# Patient Record
Sex: Female | Born: 2000 | Race: Black or African American | Hispanic: No | Marital: Single | Smoking: Never smoker
Health system: Southern US, Community
[De-identification: ages and names within clinical notes are randomized; demographics above are authoritative.]

## PROBLEM LIST (undated history)

## (undated) DIAGNOSIS — W3400XA Accidental discharge from unspecified firearms or gun, initial encounter: Secondary | ICD-10-CM

---
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
## 2019-10-16 ENCOUNTER — Encounter: Payer: Self-pay | Admitting: Obstetrics and Gynecology

## 2019-10-16 ENCOUNTER — Ambulatory Visit (INDEPENDENT_AMBULATORY_CARE_PROVIDER_SITE_OTHER): Payer: Medicaid Other | Admitting: Obstetrics and Gynecology

## 2019-10-16 ENCOUNTER — Other Ambulatory Visit: Payer: Self-pay

## 2019-10-16 ENCOUNTER — Other Ambulatory Visit (HOSPITAL_COMMUNITY)
Admission: RE | Admit: 2019-10-16 | Discharge: 2019-10-16 | Disposition: A | Discharge status: Home / Self Care | Payer: Medicaid Other | Source: Ambulatory Visit | Attending: Obstetrics and Gynecology | Admitting: Obstetrics and Gynecology

## 2019-10-16 VITALS — BP 135/81 | HR 86 | Wt 122.1 lb

## 2019-10-16 DIAGNOSIS — Z113 Encounter for screening for infections with a predominantly sexual mode of transmission: Secondary | ICD-10-CM

## 2019-10-16 DIAGNOSIS — Z23 Encounter for immunization: Secondary | ICD-10-CM | POA: Diagnosis not present

## 2019-10-16 DIAGNOSIS — Z0000 Encounter for general adult medical examination without abnormal findings: Secondary | ICD-10-CM
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
## 2019-10-16 NOTE — Progress Notes (Signed)
Has Nexplanon  Just wants to establish care and get tested for all things.

## 2019-10-17 LAB — RPR: RPR Ser Ql: NONREACTIVE

## 2019-10-17 LAB — CERVICOVAGINAL ANCILLARY ONLY
Bacterial Vaginitis (gardnerella): POSITIVE — AB
Candida Glabrata: NEGATIVE
Candida Vaginitis: POSITIVE — AB
Chlamydia: NEGATIVE
Comment: NEGATIVE
Comment: NEGATIVE
Comment: NEGATIVE
Comment: NEGATIVE
Comment: NEGATIVE
Comment: NORMAL
Neisseria Gonorrhea: NEGATIVE
Trichomonas: POSITIVE — AB

## 2019-10-17 LAB — HEPATITIS C ANTIBODY: Hep C Virus Ab: <0.1 s/co ratio (ref 0.0–0.9)

## 2019-10-17 LAB — HEPATITIS B SURFACE ANTIGEN: Hepatitis B Surface Ag: NEGATIVE

## 2019-10-17 LAB — HIV ANTIBODY (ROUTINE TESTING W REFLEX): HIV Screen 4th Generation wRfx: NONREACTIVE

## 2019-10-23 ENCOUNTER — Encounter: Payer: Self-pay | Admitting: General Practice

## 2019-10-23 ENCOUNTER — Telehealth: Payer: Self-pay | Admitting: Obstetrics and Gynecology

## 2019-10-23 MED ORDER — METRONIDAZOLE 500 MG PO TABS: 2000.0000 mg | ORAL_TABLET | Freq: Once | ORAL | 0 refills | Status: AC

## 2019-10-23 NOTE — Telephone Encounter (Signed)
+   trichomonas. Have attempted to call patient several times. No answer and no option to leave VM. Will have office send her a letter.   Rx: Flagyl 2 grams PO   Clary Boulais, Harolyn Rutherford, NP 10/23/2019 9:33 AM

## 2020-05-28 ENCOUNTER — Ambulatory Visit: Admit: 2020-05-28 | Discharge status: Against Medical Advice | Payer: Medicaid Other

## 2020-05-29 ENCOUNTER — Ambulatory Visit: Payer: Self-pay

## 2020-05-30 ENCOUNTER — Ambulatory Visit: Payer: Self-pay

## 2020-05-31 ENCOUNTER — Encounter: Payer: Self-pay | Admitting: Emergency Medicine

## 2020-05-31 ENCOUNTER — Ambulatory Visit: Payer: Self-pay

## 2020-05-31 ENCOUNTER — Ambulatory Visit
Admission: EM | Admit: 2020-05-31 | Discharge: 2020-05-31 | Disposition: A | Discharge status: Home / Self Care | Payer: Medicaid Other | Attending: Emergency Medicine | Admitting: Emergency Medicine

## 2020-05-31 DIAGNOSIS — R300 Dysuria: Secondary | ICD-10-CM | POA: Diagnosis not present

## 2020-05-31 DIAGNOSIS — Z202 Contact with and (suspected) exposure to infections with a predominantly sexual mode of transmission: Secondary | ICD-10-CM | POA: Diagnosis present

## 2020-05-31 LAB — POCT URINALYSIS DIP (MANUAL ENTRY)
Bilirubin, UA: NEGATIVE
Glucose, UA: NEGATIVE mg/dL
Ketones, POC UA: NEGATIVE mg/dL
Nitrite, UA: POSITIVE — AB
Protein Ur, POC: 100 mg/dL — AB
Spec Grav, UA: >=1.03 — AB (ref 1.010–1.025)
Urobilinogen, UA: 1 E.U./dL
pH, UA: 5.5 (ref 5.0–8.0)

## 2020-05-31 MED ORDER — NITROFURANTOIN MONOHYD MACRO 100 MG PO CAPS: 100.0000 mg | ORAL_CAPSULE | Freq: Two times a day (BID) | ORAL | 0 refills | Status: DC

## 2020-05-31 MED ORDER — METRONIDAZOLE 500 MG PO TABS: 500.0000 mg | ORAL_TABLET | Freq: Two times a day (BID) | ORAL | 0 refills | Status: DC
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
## 2020-05-31 NOTE — Discharge Instructions (Signed)
Urine concerning for infection Culture sent.  We will follow up with you regarding abnormal results Vaginal self-swab obtained.  We will follow up with you regarding abnormal results Macrobid prescribed for UTI Metronidazole prescribed for trich infection If tests results are positive, please abstain from sexual activity until you and your partner(s) have been treated Follow up with PCP Return here or go to ER if you have any new or worsening symptoms fever, chills, nausea, vomiting, abdominal or pelvic pain, painful intercourse, vaginal discharge, vaginal bleeding, persistent symptoms despite treatment, etc..Marland Kitchen

## 2020-05-31 NOTE — ED Triage Notes (Addendum)
Partner recently tested + for trich.  Pt is having burning with urination. Pt is currently on her menstrual cycle.

## 2020-06-02 LAB — CERVICOVAGINAL ANCILLARY ONLY
Bacterial Vaginitis (gardnerella): POSITIVE — AB
Candida Glabrata: NEGATIVE
Candida Vaginitis: POSITIVE — AB
Chlamydia: NEGATIVE
Comment: NEGATIVE
Comment: NEGATIVE
Comment: NEGATIVE
Comment: NEGATIVE
Comment: NEGATIVE
Comment: NORMAL
Neisseria Gonorrhea: NEGATIVE
Trichomonas: POSITIVE — AB

## 2020-06-03 ENCOUNTER — Telehealth (HOSPITAL_COMMUNITY): Payer: Self-pay | Admitting: Emergency Medicine

## 2020-06-03 LAB — URINE CULTURE
Culture: >=100000 — AB
Special Requests: NORMAL

## 2020-06-03 MED ORDER — FLUCONAZOLE 150 MG PO TABS: 150.0000 mg | ORAL_TABLET | Freq: Once | ORAL | 0 refills | Status: AC

## 2021-09-30 ENCOUNTER — Ambulatory Visit: Payer: Medicaid Other | Admitting: Family

## 2021-10-07 DIAGNOSIS — Z3046 Encounter for surveillance of implantable subdermal contraceptive: Secondary | ICD-10-CM | POA: Diagnosis not present

## 2021-10-07 DIAGNOSIS — Z113 Encounter for screening for infections with a predominantly sexual mode of transmission: Secondary | ICD-10-CM | POA: Diagnosis not present

## 2021-11-24 ENCOUNTER — Ambulatory Visit: Payer: Medicaid Other | Admitting: Obstetrics and Gynecology

## 2021-12-01 ENCOUNTER — Ambulatory Visit: Payer: Medicaid Other | Admitting: Certified Nurse Midwife

## 2023-02-08 DIAGNOSIS — Z3046 Encounter for surveillance of implantable subdermal contraceptive: Secondary | ICD-10-CM | POA: Diagnosis not present

## 2023-04-15 ENCOUNTER — Ambulatory Visit
Admission: RE | Admit: 2023-04-15 | Discharge: 2023-04-15 | Disposition: A | Discharge status: Home / Self Care | Payer: Medicaid Other | Source: Ambulatory Visit | Attending: Physician Assistant | Admitting: Physician Assistant

## 2023-04-15 ENCOUNTER — Ambulatory Visit: Payer: Medicaid Other

## 2023-04-15 VITALS — BP 127/84 | HR 103 | Temp 98.2°F | Resp 13

## 2023-04-15 DIAGNOSIS — Z3201 Encounter for pregnancy test, result positive: Secondary | ICD-10-CM

## 2023-04-15 LAB — POCT URINE PREGNANCY: Preg Test, Ur: POSITIVE — AB
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
## 2023-04-15 NOTE — Discharge Instructions (Signed)
Start a prenatal vitamin  Avoid smoking, drinking alcohol, and drug use.  Contact OBGYN on Monday.  They will typically schedule your first appointment around 7-10 weeks.  Some spotting is normal, but if you are experiencing heavy bleeding please go to [facility] at [address]

## 2023-04-15 NOTE — ED Triage Notes (Signed)
Pt presents for pregnancy test. She took two home test and the line was faded. Pt LMP was 7/4.

## 2023-04-16 ENCOUNTER — Ambulatory Visit: Payer: Medicaid Other

## 2023-04-20 ENCOUNTER — Ambulatory Visit: Payer: Medicaid Other

## 2023-04-22 ENCOUNTER — Ambulatory Visit
Admission: RE | Admit: 2023-04-22 | Discharge: 2023-04-22 | Disposition: A | Discharge status: Home / Self Care | Payer: Medicaid Other | Source: Ambulatory Visit | Attending: Physician Assistant | Admitting: Physician Assistant

## 2023-04-22 ENCOUNTER — Other Ambulatory Visit: Payer: Self-pay

## 2023-04-22 VITALS — BP 110/67 | HR 108 | Temp 98.2°F | Resp 16 | Ht 61.0 in | Wt 125.0 lb

## 2023-04-22 DIAGNOSIS — Z113 Encounter for screening for infections with a predominantly sexual mode of transmission: Secondary | ICD-10-CM | POA: Insufficient documentation

## 2023-04-22 NOTE — ED Triage Notes (Signed)
"  I am pregnant and want to make sure everything is ok". "I want to be checked for STI's including blood work". Current Symptoms: "cramps". No STI symptoms.
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
## 2023-04-24 ENCOUNTER — Ambulatory Visit: Payer: Medicaid Other

## 2023-04-25 ENCOUNTER — Telehealth (HOSPITAL_COMMUNITY): Payer: Self-pay | Admitting: Emergency Medicine

## 2023-04-25 MED ORDER — METRONIDAZOLE 500 MG PO TABS: 500.0000 mg | ORAL_TABLET | Freq: Two times a day (BID) | ORAL | 0 refills | Status: DC

## 2023-05-10 ENCOUNTER — Ambulatory Visit: Payer: Medicaid Other | Admitting: Internal Medicine

## 2023-05-13 ENCOUNTER — Emergency Department (HOSPITAL_COMMUNITY)
Admission: EM | Admit: 2023-05-13 | Discharge: 2023-05-13 | Disposition: A | Discharge status: Home / Self Care | Payer: Medicaid Other | Attending: Emergency Medicine | Admitting: Emergency Medicine

## 2023-05-13 ENCOUNTER — Other Ambulatory Visit: Payer: Self-pay

## 2023-05-13 ENCOUNTER — Emergency Department (HOSPITAL_COMMUNITY): Payer: Medicaid Other

## 2023-05-13 DIAGNOSIS — O368910 Maternal care for other specified fetal problems, first trimester, not applicable or unspecified: Secondary | ICD-10-CM | POA: Diagnosis not present

## 2023-05-13 DIAGNOSIS — O208 Other hemorrhage in early pregnancy: Secondary | ICD-10-CM | POA: Diagnosis not present

## 2023-05-13 DIAGNOSIS — Z3A01 Less than 8 weeks gestation of pregnancy: Secondary | ICD-10-CM | POA: Insufficient documentation

## 2023-05-13 DIAGNOSIS — R1030 Lower abdominal pain, unspecified: Secondary | ICD-10-CM | POA: Diagnosis not present

## 2023-05-13 DIAGNOSIS — Z3490 Encounter for supervision of normal pregnancy, unspecified, unspecified trimester: Secondary | ICD-10-CM

## 2023-05-13 DIAGNOSIS — O26891 Other specified pregnancy related conditions, first trimester: Secondary | ICD-10-CM | POA: Diagnosis not present

## 2023-05-13 LAB — URINALYSIS, ROUTINE W REFLEX MICROSCOPIC
Bilirubin Urine: NEGATIVE
Glucose, UA: NEGATIVE mg/dL
Hgb urine dipstick: NEGATIVE
Ketones, ur: 20 mg/dL — AB
Leukocytes,Ua: NEGATIVE
Nitrite: NEGATIVE
Protein, ur: NEGATIVE mg/dL
Specific Gravity, Urine: 1.019 (ref 1.005–1.030)
pH: 6 (ref 5.0–8.0)

## 2023-05-13 LAB — PREGNANCY, URINE: Preg Test, Ur: POSITIVE — AB

## 2023-05-13 LAB — HCG, QUANTITATIVE, PREGNANCY: hCG, Beta Chain, Quant, S: 47767 m[IU]/mL — ABNORMAL HIGH (ref <5)

## 2023-05-13 MED ORDER — PRENATAL COMPLETE 14-0.4 MG PO TABS: 1.0000 | ORAL_TABLET | Freq: Every day | ORAL | 1 refills | Status: DC

## 2023-05-13 NOTE — ED Triage Notes (Signed)
Pt arrived via POV. C/o intermittent cramping since they found out they were pregnant. Pt states they are a few weeks along. No bleeding.

## 2023-05-13 NOTE — Discharge Instructions (Addendum)
The ultrasound shows an intrauterine pregnancy at 7 weeks 1 day.  Follow up with an OB GYN doctor to establish prenatal care.  You can take tylenol safely during pregnancy.  Start taking prenatal vitamins.

## 2023-05-13 NOTE — ED Provider Notes (Signed)
Pt initially seen by Dr Theresia Lo.  Pt with lower abd cramping, early pregnancy.  Plan was to follow up on pelvic ultrasound.  US shows 7 week 1 day iup with fetal cardiac activity.  Small subchorionic hemorrhage noted  Stable for outpatient OB/GYN follow-up   Linwood Dibbles, MD 05/13/23 1708

## 2023-05-13 NOTE — ED Notes (Signed)
Pt ambulated to BR with steady gait to attempt to give urine sample.

## 2023-05-13 NOTE — ED Notes (Signed)
AVS with prescriptions provided to and discussed with patient. Pt verbalizes understanding of discharge instructions and denies any questions or concerns at this time. Pt ambulated out of department independently with steady gait. ? ?

## 2023-05-13 NOTE — ED Provider Notes (Signed)
[facility] Provider Note   CSN: [id number] Arrival date & time: 05/13/23  1323     History  Chief Complaint  Patient presents with   Abdominal Cramping    Yolanda Dunn is a 22 y.o. female.  Patient is a 22 year old female at approximately [redacted] weeks gestation by LMP presenting to the emergency department with abdominal pain.  Patient reports over the last 2 weeks she has had increasing cramping across her lower abdomen.  She states that she woke up this morning with a cramping pain.  She denies any fevers, nausea, vomiting, diarrhea or constipation, dysuria or hematuria.  She denies any vaginal bleeding, discharge or leakage of fluid.  She states that she has not yet had an ultrasound this of pregnancy or established with OB.  The history is provided by the patient.  Abdominal Cramping       Home Medications Prior to Admission medications   Medication Sig Start Date End Date Taking? Authorizing Provider  metroNIDAZOLE (FLAGYL) 500 MG tablet Take 1 tablet (500 mg total) by mouth 2 (two) times daily. 04/25/23   Merrilee Jansky, MD  ferrous gluconate (FERGON) 324 MG tablet Take 1 tablet (324 mg total) by mouth 2 (two) times daily with a meal. Patient not taking: Reported on 02/19/2018 01/15/18 05/31/20  Barnetta Chapel, PA-C      Allergies    Patient has no known allergies.    Review of Systems   Review of Systems  Physical Exam Updated Vital Signs BP (!) 142/97 (BP Location: Left Arm)   Pulse 92   Temp 98.6 F (37 C) (Oral)   Resp 18   Ht 5\' 1"  (1.549 m)   Wt 56.7 kg   LMP 03/22/2023 (Exact Date)   SpO2 100%   BMI 23.62 kg/m  Physical Exam Vitals and nursing note reviewed.  Constitutional:      General: She is not in acute distress.    Appearance: Normal appearance.  HENT:     Head: Normocephalic and atraumatic.     Nose: Nose normal.     Mouth/Throat:     Mouth: Mucous membranes are moist.  Eyes:      Extraocular Movements: Extraocular movements intact.     Conjunctiva/sclera: Conjunctivae normal.  Cardiovascular:     Rate and Rhythm: Normal rate and regular rhythm.     Heart sounds: Normal heart sounds.  Pulmonary:     Effort: Pulmonary effort is normal.     Breath sounds: Normal breath sounds.  Abdominal:     General: Abdomen is flat.     Palpations: Abdomen is soft.     Tenderness: There is no abdominal tenderness. There is no right CVA tenderness or left CVA tenderness.  Musculoskeletal:        General: Normal range of motion.     Cervical back: Normal range of motion.  Skin:    General: Skin is warm and dry.  Neurological:     General: No focal deficit present.     Mental Status: She is alert and oriented to person, place, and time.  Psychiatric:        Mood and Affect: Mood normal.        Behavior: Behavior normal.     ED Results / Procedures / Treatments   Labs (all labs ordered are listed, but only abnormal results are displayed) Labs Reviewed  URINALYSIS, ROUTINE W REFLEX MICROSCOPIC - Abnormal; Notable for the following components:  Result Value   APPearance CLOUDY (*)    Ketones, ur 20 (*)    All other components within normal limits  PREGNANCY, URINE - Abnormal; Notable for the following components:   Preg Test, Ur POSITIVE (*)    All other components within normal limits  HCG, QUANTITATIVE, PREGNANCY    EKG None  Radiology No results found.  Procedures Procedures    Medications Ordered in ED Medications - No data to display  ED Course/ Medical Decision Making/ A&P Clinical Course as of 05/13/23 1514  Sun May 13, 2023  1514 Patient signed out to Dr. Lynelle Doctor pending [place] and reassessment. [VK]    Clinical Course User Index [VK] Rexford Maus, DO                                 Medical Decision Making This patient presents to the ED with chief complaint(s) of abdominal pain with pertinent past medical history of [redacted] weeks pregnant  which further complicates the presenting complaint. The complaint involves an extensive differential diagnosis and also carries with it a high risk of complications and morbidity.    The differential diagnosis includes UTI, ectopic pregnancy, miscarriage, no fever or point abdominal tenderness making appendicitis or other intra-abdominal infection unlikely  Additional history obtained: Additional history obtained from N/A Records reviewed urgent care records  ED Course and Reassessment: On patient's arrival she is hemodynamically stable in no acute distress.  Abdomen is soft and minimally tender to palpation.  Patient will have urine performed to evaluate for UTI as well as an ultrasound to evaluate for IUP versus ectopic.  Patient declined any pain medication at this time and will be closely reassessed.  Independent labs interpretation:  The following labs were independently interpreted: UA negative, positive pregnancy  Independent visualization of imaging: - Pending     Amount and/or Complexity of Data Reviewed Labs: ordered. Radiology: ordered.          Final Clinical Impression(s) / ED Diagnoses Final diagnoses:  None    Rx / DC Orders ED Discharge Orders     None         Rexford Maus, DO 05/13/23 1514

## 2023-05-18 ENCOUNTER — Ambulatory Visit: Payer: Medicaid Other | Admitting: Nurse Practitioner

## 2023-05-18 ENCOUNTER — Telehealth: Payer: Self-pay

## 2023-05-18 NOTE — Telephone Encounter (Signed)
8.30.24 pt no show/ pt called today @ 1:09pm to cancel due to work/ no letter sent / block for future appt

## 2023-05-24 ENCOUNTER — Ambulatory Visit: Payer: Medicaid Other

## 2023-06-01 ENCOUNTER — Ambulatory Visit: Payer: Medicaid Other

## 2023-06-05 ENCOUNTER — Telehealth: Payer: Medicaid Other

## 2023-06-13 ENCOUNTER — Ambulatory Visit: Payer: Medicaid Other

## 2023-06-13 ENCOUNTER — Ambulatory Visit (INDEPENDENT_AMBULATORY_CARE_PROVIDER_SITE_OTHER): Payer: Medicaid Other | Admitting: Obstetrics & Gynecology

## 2023-06-13 ENCOUNTER — Other Ambulatory Visit (HOSPITAL_COMMUNITY)
Admission: RE | Admit: 2023-06-13 | Discharge: 2023-06-13 | Disposition: A | Discharge status: Home / Self Care | Payer: Medicaid Other | Source: Ambulatory Visit | Attending: Obstetrics & Gynecology | Admitting: Obstetrics & Gynecology

## 2023-06-13 ENCOUNTER — Other Ambulatory Visit: Payer: Self-pay

## 2023-06-13 ENCOUNTER — Encounter: Payer: Self-pay | Admitting: Obstetrics & Gynecology

## 2023-06-13 VITALS — BP 109/70 | HR 90 | Wt 123.0 lb

## 2023-06-13 DIAGNOSIS — Z3491 Encounter for supervision of normal pregnancy, unspecified, first trimester: Secondary | ICD-10-CM

## 2023-06-13 DIAGNOSIS — Z3490 Encounter for supervision of normal pregnancy, unspecified, unspecified trimester: Secondary | ICD-10-CM | POA: Insufficient documentation

## 2023-06-13 DIAGNOSIS — Z3A11 11 weeks gestation of pregnancy: Secondary | ICD-10-CM | POA: Diagnosis not present

## 2023-06-13 MED ORDER — BLOOD PRESSURE KIT DEVI
1.0000 | Freq: Once | 0 refills | Status: AC
Start: 2023-06-13 — End: 2023-06-13

## 2023-06-13 MED ORDER — ASPIRIN 81 MG PO TBEC
81.0000 mg | DELAYED_RELEASE_TABLET | Freq: Every day | ORAL | 2 refills | Status: DC
Start: 2023-06-13 — End: 2023-09-01
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
## 2023-06-14 ENCOUNTER — Encounter: Payer: Self-pay | Admitting: *Deleted

## 2023-06-14 LAB — COMPREHENSIVE METABOLIC PANEL
ALT: 13 IU/L (ref 0–32)
AST: 14 IU/L (ref 0–40)
Albumin: 4.1 g/dL (ref 4.0–5.0)
Alkaline Phosphatase: 51 IU/L (ref 44–121)
BUN/Creatinine Ratio: 15 (ref 9–23)
BUN: 8 mg/dL (ref 6–20)
Bilirubin Total: 0.2 mg/dL (ref 0.0–1.2)
CO2: 21 mmol/L (ref 20–29)
Calcium: 9.2 mg/dL (ref 8.7–10.2)
Chloride: 103 mmol/L (ref 96–106)
Creatinine, Ser: 0.55 mg/dL — ABNORMAL LOW (ref 0.57–1.00)
Globulin, Total: 2.9 g/dL (ref 1.5–4.5)
Glucose: 81 mg/dL (ref 70–99)
Potassium: 4.4 mmol/L (ref 3.5–5.2)
Sodium: 138 mmol/L (ref 134–144)
Total Protein: 7 g/dL (ref 6.0–8.5)
eGFR: 132 mL/min/{1.73_m2} (ref >59)

## 2023-06-14 LAB — HEMOGLOBIN A1C
Est. average glucose Bld gHb Est-mCnc: 97 mg/dL
Hgb A1c MFr Bld: 5 % (ref 4.8–5.6)

## 2023-06-14 LAB — CBC/D/PLT+RPR+RH+ABO+RUBIGG...
Antibody Screen: NEGATIVE
Basophils Absolute: 0 10*3/uL (ref 0.0–0.2)
Basos: 0 %
EOS (ABSOLUTE): 0 10*3/uL (ref 0.0–0.4)
Eos: 0 %
HCV Ab: NONREACTIVE
HIV Screen 4th Generation wRfx: NONREACTIVE
Hematocrit: 37 % (ref 34.0–46.6)
Hemoglobin: 12 g/dL (ref 11.1–15.9)
Hepatitis B Surface Ag: NEGATIVE
Immature Grans (Abs): 0 10*3/uL (ref 0.0–0.1)
Immature Granulocytes: 0 %
Lymphocytes Absolute: 1.6 10*3/uL (ref 0.7–3.1)
Lymphs: 29 %
MCH: 32.9 pg (ref 26.6–33.0)
MCHC: 32.4 g/dL (ref 31.5–35.7)
MCV: 101 fL — ABNORMAL HIGH (ref 79–97)
Monocytes Absolute: 0.4 10*3/uL (ref 0.1–0.9)
Monocytes: 8 %
Neutrophils Absolute: 3.4 10*3/uL (ref 1.4–7.0)
Neutrophils: 63 %
Platelets: 225 10*3/uL (ref 150–450)
RBC: 3.65 x10E6/uL — ABNORMAL LOW (ref 3.77–5.28)
RDW: 12.5 % (ref 11.7–15.4)
RPR Ser Ql: NONREACTIVE
Rh Factor: POSITIVE
Rubella Antibodies, IGG: 3.34 index (ref >0.99)
WBC: 5.4 10*3/uL (ref 3.4–10.8)

## 2023-06-14 LAB — HCV INTERPRETATION

## 2023-06-15 LAB — CYTOLOGY - PAP
Chlamydia: NEGATIVE
Comment: NEGATIVE
Comment: NEGATIVE
Comment: NEGATIVE
Comment: NORMAL
Diagnosis: NEGATIVE
High risk HPV: NEGATIVE
Neisseria Gonorrhea: NEGATIVE
Trichomonas: NEGATIVE

## 2023-06-15 LAB — CULTURE, OB URINE

## 2023-06-15 LAB — URINE CULTURE, OB REFLEX

## 2023-06-20 LAB — HORIZON CUSTOM: REPORT SUMMARY: NEGATIVE

## 2023-06-20 LAB — PANORAMA PRENATAL TEST FULL PANEL:PANORAMA TEST PLUS 5 ADDITIONAL MICRODELETIONS: FETAL FRACTION: 9.1

## 2023-07-11 ENCOUNTER — Other Ambulatory Visit: Payer: Self-pay

## 2023-07-11 ENCOUNTER — Ambulatory Visit (INDEPENDENT_AMBULATORY_CARE_PROVIDER_SITE_OTHER): Payer: Medicaid Other | Admitting: Family Medicine

## 2023-07-11 VITALS — BP 116/85 | HR 112 | Wt 125.3 lb

## 2023-07-11 DIAGNOSIS — Z3492 Encounter for supervision of normal pregnancy, unspecified, second trimester: Secondary | ICD-10-CM

## 2023-07-11 DIAGNOSIS — Z3A15 15 weeks gestation of pregnancy: Secondary | ICD-10-CM

## 2023-07-11 DIAGNOSIS — W3400XA Accidental discharge from unspecified firearms or gun, initial encounter: Secondary | ICD-10-CM
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
## 2023-07-13 ENCOUNTER — Encounter: Payer: Self-pay | Admitting: Family Medicine

## 2023-07-13 LAB — AFP, SERUM, OPEN SPINA BIFIDA
AFP MoM: 2.18
AFP Value: 78.6 ng/mL
Gest. Age on Collection Date: 15 wk
Maternal Age At EDD: 23.7 a
OSBR Risk 1 IN: 1092
Test Results:: NEGATIVE
Weight: 125 [lb_av]

## 2023-07-22 ENCOUNTER — Encounter: Payer: Self-pay | Admitting: Obstetrics & Gynecology

## 2023-08-01 ENCOUNTER — Encounter: Payer: Self-pay | Admitting: *Deleted

## 2023-08-01 DIAGNOSIS — Z3687 Encounter for antenatal screening for uncertain dates: Secondary | ICD-10-CM | POA: Insufficient documentation

## 2023-08-06 ENCOUNTER — Ambulatory Visit: Payer: Medicaid Other | Admitting: *Deleted

## 2023-08-06 ENCOUNTER — Encounter: Payer: Self-pay | Admitting: *Deleted

## 2023-08-06 ENCOUNTER — Ambulatory Visit: Payer: Medicaid Other | Attending: Obstetrics & Gynecology

## 2023-08-06 ENCOUNTER — Other Ambulatory Visit: Payer: Self-pay

## 2023-08-06 ENCOUNTER — Other Ambulatory Visit: Payer: Self-pay | Admitting: *Deleted

## 2023-08-06 VITALS — BP 112/57 | HR 63

## 2023-08-06 DIAGNOSIS — O358XX0 Maternal care for other (suspected) fetal abnormality and damage, not applicable or unspecified: Secondary | ICD-10-CM | POA: Insufficient documentation

## 2023-08-06 DIAGNOSIS — Z3492 Encounter for supervision of normal pregnancy, unspecified, second trimester: Secondary | ICD-10-CM

## 2023-08-06 DIAGNOSIS — Z3491 Encounter for supervision of normal pregnancy, unspecified, first trimester: Secondary | ICD-10-CM | POA: Diagnosis not present

## 2023-08-06 DIAGNOSIS — Z362 Encounter for other antenatal screening follow-up: Secondary | ICD-10-CM

## 2023-08-06 DIAGNOSIS — Z363 Encounter for antenatal screening for malformations: Secondary | ICD-10-CM | POA: Diagnosis not present

## 2023-08-06 DIAGNOSIS — Z3A19 19 weeks gestation of pregnancy: Secondary | ICD-10-CM | POA: Insufficient documentation

## 2023-08-08 ENCOUNTER — Encounter: Payer: Medicaid Other | Admitting: Obstetrics and Gynecology

## 2023-08-20 ENCOUNTER — Encounter: Payer: Self-pay | Admitting: Obstetrics & Gynecology

## 2023-08-31 ENCOUNTER — Encounter: Payer: Self-pay | Admitting: Obstetrics & Gynecology

## 2023-08-31 ENCOUNTER — Inpatient Hospital Stay (HOSPITAL_COMMUNITY)
Admission: AD | Admit: 2023-08-31 | Discharge: 2023-08-31 | Disposition: A | Discharge status: Home / Self Care | Payer: Medicaid Other | Attending: Obstetrics and Gynecology | Admitting: Obstetrics and Gynecology

## 2023-08-31 ENCOUNTER — Encounter (HOSPITAL_COMMUNITY): Payer: Self-pay | Admitting: Obstetrics and Gynecology

## 2023-08-31 DIAGNOSIS — O26892 Other specified pregnancy related conditions, second trimester: Secondary | ICD-10-CM | POA: Insufficient documentation

## 2023-08-31 DIAGNOSIS — O99412 Diseases of the circulatory system complicating pregnancy, second trimester: Secondary | ICD-10-CM | POA: Insufficient documentation

## 2023-08-31 DIAGNOSIS — I498 Other specified cardiac arrhythmias: Secondary | ICD-10-CM | POA: Diagnosis not present

## 2023-08-31 DIAGNOSIS — R0789 Other chest pain: Secondary | ICD-10-CM | POA: Diagnosis not present

## 2023-08-31 DIAGNOSIS — Z3A23 23 weeks gestation of pregnancy: Secondary | ICD-10-CM

## 2023-08-31 MED ORDER — SIMETHICONE 80 MG PO CHEW
80.0000 mg | CHEWABLE_TABLET | Freq: Once | ORAL | Status: AC
  Administered 2023-08-31: 80 mg via ORAL
  Filled 2023-08-31: qty 1

## 2023-08-31 MED ORDER — CYCLOBENZAPRINE HCL 5 MG PO TABS: 5.0000 mg | ORAL_TABLET | Freq: Every evening | ORAL | 0 refills | Status: DC | PRN

## 2023-08-31 MED ORDER — ACETAMINOPHEN 500 MG PO TABS
1000.0000 mg | ORAL_TABLET | Freq: Once | ORAL | Status: AC
  Administered 2023-08-31: 1000 mg via ORAL
  Filled 2023-08-31: qty 2
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
## 2023-09-01 ENCOUNTER — Other Ambulatory Visit: Payer: Self-pay | Admitting: Obstetrics & Gynecology

## 2023-09-01 DIAGNOSIS — Z3491 Encounter for supervision of normal pregnancy, unspecified, first trimester: Secondary | ICD-10-CM

## 2023-09-01 DIAGNOSIS — Z3A11 11 weeks gestation of pregnancy: Secondary | ICD-10-CM

## 2023-09-01 MED ORDER — CYCLOBENZAPRINE HCL 5 MG PO TABS
5.0000 mg | ORAL_TABLET | Freq: Three times a day (TID) | ORAL | 3 refills | Status: DC | PRN
Start: 2023-09-01 — End: 2023-10-23

## 2023-09-01 MED ORDER — ASPIRIN 81 MG PO TBEC
81.0000 mg | DELAYED_RELEASE_TABLET | Freq: Every day | ORAL | 2 refills | Status: DC
Start: 2023-09-01 — End: 2023-12-10

## 2023-09-01 MED ORDER — PRENATAL COMPLETE 14-0.4 MG PO TABS
1.0000 | ORAL_TABLET | Freq: Every day | ORAL | 2 refills | Status: AC
Start: 2023-09-01 — End: ?

## 2023-09-10 ENCOUNTER — Other Ambulatory Visit: Payer: Self-pay

## 2023-09-10 ENCOUNTER — Ambulatory Visit: Payer: Medicaid Other | Attending: Obstetrics

## 2023-09-10 ENCOUNTER — Encounter: Payer: Self-pay | Admitting: Obstetrics & Gynecology

## 2023-09-10 ENCOUNTER — Ambulatory Visit: Payer: Medicaid Other | Admitting: Obstetrics & Gynecology

## 2023-09-10 VITALS — BP 124/70 | HR 81 | Wt 139.0 lb

## 2023-09-10 DIAGNOSIS — Z3492 Encounter for supervision of normal pregnancy, unspecified, second trimester: Secondary | ICD-10-CM

## 2023-09-10 DIAGNOSIS — Z3A24 24 weeks gestation of pregnancy: Secondary | ICD-10-CM

## 2023-09-10 DIAGNOSIS — O358XX0 Maternal care for other (suspected) fetal abnormality and damage, not applicable or unspecified: Secondary | ICD-10-CM | POA: Diagnosis not present

## 2023-09-10 DIAGNOSIS — Z362 Encounter for other antenatal screening follow-up: Secondary | ICD-10-CM | POA: Insufficient documentation

## 2023-09-10 NOTE — Patient Instructions (Addendum)
Return to office for any scheduled appointments. Call the office or go to the MAU at [facility] if: You begin to have strong, frequent contractions Your water breaks.  Sometimes it is a big gush of fluid, sometimes it is just a trickle that keeps getting your underwear wet or running down your legs You have vaginal bleeding.  It is normal to have a small amount of spotting if your cervix was checked.  You do not feel your baby moving like normal.  If you do not, get something to eat and drink and lay down and focus on feeling your baby move.   If your baby is still not moving like normal, you should call the office or go to MAU. Any other obstetric concerns.  Oral Glucose Tolerance Test During Pregnancy Why am I having this test? The oral glucose tolerance test (GTT) is done to check how your body processes blood sugar (glucose). This is one of several tests used to diagnose diabetes that develops during pregnancy (gestational diabetes mellitus). Gestational diabetes is a short-term form of diabetes that some women develop while they are pregnant. It usually occurs during the second or third trimester of pregnancy and goes away after delivery. Testing, or screening, for gestational diabetes usually occurs around 28 of pregnancy. This test may also be needed earlier if: You have a history of gestational diabetes. There is a history of giving birth to very large babies or of losing pregnancies (having stillbirths). You have signs and symptoms of diabetes, such as: Changes in your eyesight. Tingling or numbness in your hands or feet. Changes in hunger, thirst, and urination, and these are not explained by your pregnancy. What is being tested? This test measures the amount of glucose in your blood at different times during a period of 2 hours. This shows how well your body can process glucose.  You will have three separate blood draws. What kind of sample is  taken?  Blood samples are required for this test. They are usually collected by inserting a needle into a blood vessel. How do I prepare for this test? For 3 days before your test, eat normally. Have plenty of carbohydrate-rich foods. You will be asked not to eat or drink anything other than water (to fast) starting 8-10 hours before the test. Tell a health care provider about: All medicines you are taking, including vitamins, herbs, eye drops, creams, and over-the-counter medicines. Any blood disorders you have. Any surgeries you have had. Any medical conditions you have. What happens during the test? First, your blood glucose will be measured. This is referred to as your fasting blood glucose because you fasted before the test. Then, you will drink a glucose solution that contains a certain amount of glucose. Your blood glucose will be measured again 1 and 2 hours after you drink the solution. This test takes about 2 hours to complete. You will need to stay at the testing location during this time. During the testing period: Do not eat or drink anything other than the glucose solution. Do not exercise. Do not use any products that contain nicotine or tobacco, such as cigarettes, e-cigarettes, and chewing tobacco. These can affect your test results. If you need help quitting, ask your health care provider. The testing procedure may vary among health care providers and hospitals. How are the results reported? Your results will be reported as milligrams of glucose per deciliter of blood (mg/dL) or millimoles per liter (mmol/L). There   is more than one source for screening and diagnosis reference values used to diagnose gestational diabetes. Your health care provider will compare your results to normal values that were established after testing a large group of people (reference values). Reference values may vary among labs and hospitals. For this test, reference values are: Fasting: 92 mg/dL 1  hour: 180 mg/dL  2 hour: 153 mg/dL   What do the results mean? Results below the reference values are considered normal. If one or more of your blood glucose levels are at or above the reference values, you will be diagnosed with gestational diabetes.  Talk with your health care provider about what your results mean. Questions to ask your health care provider Ask your health care provider, or the department that is doing the test: When will my results be ready? How will I get my results? What are my treatment options? What other tests do I need? What are my next steps? Summary The oral glucose tolerance test (GTT) is one of several tests used to diagnose diabetes that develops during pregnancy (gestational diabetes mellitus). Gestational diabetes is a short-term form of diabetes that some women develop while they are pregnant. You may also have this test if you have any symptoms or risk factors for this type of diabetes. Talk with your health care provider about what your results mean. This information is not intended to replace advice given to you by your health care provider. Make sure you discuss any questions you have with your health care provider.   TDaP Vaccine Pregnancy Get the Whooping Cough Vaccine While You Are Pregnant (CDC)  It is important for women to get the whooping cough vaccine in the third trimester of each pregnancy. Vaccines are the best way to prevent this disease. There are 2 different whooping cough vaccines. Both vaccines combine protection against whooping cough, tetanus and diphtheria, but they are for different age groups: Tdap: for everyone 11 years or older, including pregnant women  DTaP: for children 2 months through 6 years of age  You need the whooping cough vaccine during each of your pregnancies The recommended time to get the shot is during your 27th through 36th week of pregnancy, preferably during the earlier part of this time period. The Centers  for Disease Control and Prevention (CDC) recommends that pregnant women receive the whooping cough vaccine for adolescents and adults (called Tdap vaccine) during the third trimester of each pregnancy. The recommended time to get the shot is during your 27th through 36th week of pregnancy, preferably during the earlier part of this time period. This replaces the original recommendation that pregnant women get the vaccine only if they had not previously received it. The American College of Obstetricians and Gynecologists and the American College of Nurse-Midwives support this recommendation.  You should get the whooping cough vaccine while pregnant to pass protection to your baby frame support disabled and/or not supported in this browser  Learn why Yolanda Dunn decided to get the whooping cough vaccine in her 3rd trimester of pregnancy and how her baby girl was born with some protection against the disease. Also available on YouTube. After receiving the whooping cough vaccine, your body will create protective antibodies (proteins produced by the body to fight off diseases) and pass some of them to your baby before birth. These antibodies provide your baby some short-term protection against whooping cough in early life. These antibodies can also protect your baby from some of the more serious complications that come along   with whooping cough. Your protective antibodies are at their highest about 2 weeks after getting the vaccine, but it takes time to pass them to your baby. So the preferred time to get the whooping cough vaccine is early in your third trimester. The amount of whooping cough antibodies in your body decreases over time. That is why CDC recommends you get a whooping cough vaccine during each pregnancy. Doing so allows each of your babies to get the greatest number of protective antibodies from you. This means each of your babies will get the best protection possible against this disease.  Getting  the whooping cough vaccine while pregnant is better than getting the vaccine after you give birth Whooping cough vaccination during pregnancy is ideal so your baby will have short-term protection as soon as he is born. This early protection is important because your baby will not start getting his whooping cough vaccines until he is 2 months old. These first few months of life are when your baby is at greatest risk for catching whooping cough. This is also when he's at greatest risk for having severe, potentially life-threating complications from the infection. To avoid that gap in protection, it is best to get a whooping cough vaccine during pregnancy. You will then pass protection to your baby before he is born. To continue protecting your baby, he should get whooping cough vaccines starting at 2 months old. You may never have gotten the Tdap vaccine before and did not get it during this pregnancy. If so, you should make sure to get the vaccine immediately after you give birth, before leaving the hospital or birthing center. It will take about 2 weeks before your body develops protection (antibodies) in response to the vaccine. Once you have protection from the vaccine, you are less likely to give whooping cough to your newborn while caring for him. But remember, your baby will still be at risk for catching whooping cough from others. A recent study looked to see how effective Tdap was at preventing whooping cough in babies whose mothers got the vaccine while pregnant or in the hospital after giving birth. The study found that getting Tdap between 27 through 36 weeks of pregnancy is 85% more effective at preventing whooping cough in babies younger than 2 months old. Blood tests cannot tell if you need a whooping cough vaccine There are no blood tests that can tell you if you have enough antibodies in your body to protect yourself or your baby against whooping cough. Even if you have been sick with whooping  cough in the past or previously received the vaccine, you still should get the vaccine during each pregnancy. Breastfeeding may pass some protective antibodies onto your baby By breastfeeding, you may pass some antibodies you have made in response to the vaccine to your baby. When you get a whooping cough vaccine during your pregnancy, you will have antibodies in your breast milk that you can share with your baby as soon as your milk comes in. However, your baby will not get protective antibodies immediately if you wait to get the whooping cough vaccine until after delivering your baby. This is because it takes about 2 weeks for your body to create antibodies. Learn more about the health benefits of breastfeeding.  

## 2023-09-10 NOTE — Progress Notes (Signed)
   PRENATAL VISIT NOTE  Subjective:  Yolanda Dunn is a 22 y.o. G1P0000 at [redacted]w[redacted]d being seen today for ongoing prenatal care.  She is currently monitored for the following issues for this low-risk pregnancy and has Adjustment disorder with depressed mood; Supervision of low-risk pregnancy; and Sinus arrhythmia seen on electrocardiogram on their problem list.  Patient reports no complaints.  Contractions: Irritability. Vag. Bleeding: None.  Movement: Present. Denies leaking of fluid.   The following portions of the patient's history were reviewed and updated as appropriate: allergies, current medications, past family history, past medical history, past social history, past surgical history and problem list.   Objective:   Vitals:   09/10/23 1119  BP: 124/70  Pulse: 81  Weight: 139 lb (63 kg)    Fetal Status: Fetal Heart Rate (bpm): 146   Movement: Present     General:  Alert, oriented and cooperative. Patient is in no acute distress.  Skin: Skin is warm and dry. No rash noted.   Cardiovascular: Normal heart rate noted  Respiratory: Normal respiratory effort, no problems with respiration noted  Abdomen: Soft, gravid, appropriate for gestational age.  Pain/Pressure: Absent     Pelvic: Cervical exam deferred        Extremities: Normal range of motion.  Edema: None  Mental Status: Normal mood and affect. Normal behavior. Normal judgment and thought content.   Assessment and Plan:  Pregnancy: G1P0000 at [redacted]w[redacted]d 1. [redacted] weeks gestation of pregnancy 2. Encounter for supervision of low-risk pregnancy in second trimester (Primary) Follow up anatomy scan is today, will follow up results and manage accordingly. Third trimester labs next visit, future orders placed. - Glucose Tolerance, 2 Hours w/1 Hour; Future - CBC; Future - RPR; Future - HIV Antibody (routine testing w rflx); Future Preterm labor symptoms and general obstetric precautions including but not limited to vaginal bleeding,  contractions, leaking of fluid and fetal movement were reviewed in detail with the patient. Please refer to After Visit Summary for other counseling recommendations.   Return in about 4 weeks (around 10/08/2023) for 2 hr GTT, 3rd trimester labs, TDap, OFFICE OB VISIT (MD or APP).  Future Appointments  Date Time Provider Department Center  09/10/2023  3:30 PM [facility]    Jaynie Collins, MD

## 2023-09-19 NOTE — L&D Delivery Note (Signed)
 OB/GYN Faculty Practice Delivery Note  Yolanda Dunn is a 23 y.o. G1P0000 s/p NVSB at [redacted]w[redacted]d. She was admitted for IOL for IUGR and elevated dopplers.   ROM: 2h 109m with clear fluid GBS Status: negative Maximum Maternal Temperature: 98.9  Labor Progress: IOL with cytotec x1, AROM to complete.   Delivery Date/Time: 12/15/2023 @ 1314 Delivery: Called to room and patient was complete and pushing. Head delivered OA to LOA. No nuchal cord present. Shoulder and body delivered in usual fashion. Infant with spontaneous cry, placed on mother's abdomen, dried and stimulated. Cord clamped x 2 after >5-minute delay, and cut by FOB. Cord blood drawn. Placenta delivered spontaneously, intact, with 3-vessel cord. Fundus firm with massage and Pitocin. Labia, perineum, vagina, and cervix inspected, no laceration identified excellent hemostasis and approximation noted.   Placenta: spontaneous, intact, Shultz to pathology Complications: precipitous delivery  Lacerations: intact EBL: 47 Analgesia: none  Postpartum Planning Arly.Keller ] transfer orders to MB Arly.Keller ] discharge summary started & shared [ X] message to sent to schedule follow-up  Arly.Keller ] lists updated  Infant: boy Pincus Badder 9/9  pending  Lamont Snowball, MSN, CNM, RNC-OB Certified Nurse Midwife, [facility] 12/15/2023 1:38 PM

## 2023-10-03 ENCOUNTER — Inpatient Hospital Stay (HOSPITAL_COMMUNITY)
Admission: AD | Admit: 2023-10-03 | Discharge: 2023-10-03 | Disposition: A | Discharge status: Home / Self Care | Payer: Medicaid Other | Attending: Obstetrics & Gynecology | Admitting: Obstetrics & Gynecology

## 2023-10-03 ENCOUNTER — Other Ambulatory Visit: Payer: Self-pay

## 2023-10-03 ENCOUNTER — Encounter (HOSPITAL_COMMUNITY): Payer: Self-pay | Admitting: Obstetrics & Gynecology

## 2023-10-03 DIAGNOSIS — O210 Mild hyperemesis gravidarum: Secondary | ICD-10-CM | POA: Insufficient documentation

## 2023-10-03 DIAGNOSIS — Z3492 Encounter for supervision of normal pregnancy, unspecified, second trimester: Secondary | ICD-10-CM

## 2023-10-03 DIAGNOSIS — Z3A27 27 weeks gestation of pregnancy: Secondary | ICD-10-CM | POA: Diagnosis not present

## 2023-10-03 LAB — BASIC METABOLIC PANEL
Anion gap: 11 (ref 5–15)
BUN: 11 mg/dL (ref 6–20)
CO2: 17 mmol/L — ABNORMAL LOW (ref 22–32)
Calcium: 8.8 mg/dL — ABNORMAL LOW (ref 8.9–10.3)
Chloride: 104 mmol/L (ref 98–111)
Creatinine, Ser: 0.61 mg/dL (ref 0.44–1.00)
GFR, Estimated: >60 mL/min (ref >60)
Glucose, Bld: 81 mg/dL (ref 70–99)
Potassium: 3.4 mmol/L — ABNORMAL LOW (ref 3.5–5.1)
Sodium: 132 mmol/L — ABNORMAL LOW (ref 135–145)

## 2023-10-03 LAB — URINALYSIS, MICROSCOPIC (REFLEX)

## 2023-10-03 LAB — URINALYSIS, ROUTINE W REFLEX MICROSCOPIC
Glucose, UA: NEGATIVE mg/dL
Ketones, ur: >80 mg/dL — AB
Leukocytes,Ua: NEGATIVE
Nitrite: NEGATIVE
Protein, ur: 30 mg/dL — AB
Specific Gravity, Urine: >1.03 — ABNORMAL HIGH (ref 1.005–1.030)
pH: 6 (ref 5.0–8.0)

## 2023-10-03 LAB — POCT FERN TEST

## 2023-10-03 MED ORDER — ONDANSETRON 4 MG PO TBDP: 4.0000 mg | ORAL_TABLET | Freq: Three times a day (TID) | ORAL | 0 refills | Status: DC | PRN

## 2023-10-03 MED ORDER — ACETAMINOPHEN-CAFFEINE 500-65 MG PO TABS
2.0000 | ORAL_TABLET | Freq: Once | ORAL | Status: AC
  Administered 2023-10-03: 2 via ORAL
  Filled 2023-10-03: qty 2

## 2023-10-03 MED ORDER — GLYCOPYRROLATE 1 MG PO TABS
1.0000 mg | ORAL_TABLET | Freq: Once | ORAL | Status: AC
  Administered 2023-10-03: 1 mg via ORAL
  Filled 2023-10-03: qty 1

## 2023-10-03 MED ORDER — GLYCOPYRROLATE 1 MG PO TABS
1.0000 mg | ORAL_TABLET | Freq: Three times a day (TID) | ORAL | 0 refills | Status: DC
Start: 2023-10-03 — End: 2023-10-23

## 2023-10-03 MED ORDER — ONDANSETRON 4 MG PO TBDP
8.0000 mg | ORAL_TABLET | Freq: Once | ORAL | Status: AC
  Administered 2023-10-03: 8 mg via ORAL
  Filled 2023-10-03: qty 2

## 2023-10-03 MED ORDER — FAMOTIDINE 20 MG PO TABS
40.0000 mg | ORAL_TABLET | Freq: Once | ORAL | Status: AC
  Administered 2023-10-03: 40 mg via ORAL
  Filled 2023-10-03: qty 2

## 2023-10-03 MED ORDER — LACTATED RINGERS IV BOLUS
1000.0000 mL | Freq: Once | INTRAVENOUS | Status: AC
  Administered 2023-10-03: 1000 mL via INTRAVENOUS

## 2023-10-03 NOTE — MAU Provider Note (Signed)
 N/V    S [name] is a 23 y.o. G1P0000 pregnant female at [redacted]w[redacted]d who presents to MAU today with complaint of N/V for last two days. Inability to tolerate PO, no prescription medications at home to help with the N/V.  Feels lightheaded. Denies VB. +FM.  Reports some unknown intermittent leaking of fluid, very random by her reports. She later characterizes it as "a drop a week ago." Denies cramping, abdominal pain, or contractions.    Receives care at [facility]. Prenatal records reviewed.  Pertinent items noted in HPI and remainder of comprehensive ROS otherwise negative.   O BP 123/78   Pulse 99   Temp 98.1 F (36.7 C) (Oral)   Resp 18   Ht 5' (1.524 m)   Wt 61.1 kg   LMP 03/22/2023 (Exact Date)   SpO2 98%   BMI 26.33 kg/m  Physical Exam Vitals and nursing note reviewed.  Constitutional:      General: She is not in acute distress.    Appearance: Normal appearance. She is normal weight. She is not ill-appearing.  HENT:     Head: Normocephalic and atraumatic.     Right Ear: External ear normal.     Left Ear: External ear normal.     Nose: Nose normal.     Mouth/Throat:     Mouth: Mucous membranes are moist.     Pharynx: Oropharynx is clear.  Eyes:     Extraocular Movements: Extraocular movements intact.     Conjunctiva/sclera: Conjunctivae normal.  Cardiovascular:     Rate and Rhythm: Normal rate.  Pulmonary:     Effort: Pulmonary effort is normal. No respiratory distress.  Abdominal:     General: There is no distension.     Palpations: Abdomen is soft.     Tenderness: There is no abdominal tenderness.     Comments: gravid  Musculoskeletal:        General: No swelling. Normal range of motion.     Cervical back: Normal range of motion.  Skin:    General: Skin is warm and dry.  Neurological:     Mental Status: She is alert and oriented to person, place, and time. Mental status is at baseline.  Psychiatric:        Mood and Affect: Mood normal.        Behavior:  Behavior normal.     NST: 135bpm, moderate variability, +accels, no decels, uterine irritability but no contractions   MDM: MAU Course:  Orthostatics positive BMP Na 132, K 3.4, no gap Ferm negative  Given ODT Zofran  8mg , Robinol as well as Pepcid  upon presentation. Nursing reports not grossly ruptured. Fern negative.  Per HPI and silent toco on NST, extremely unlikely to PPROM vs PTL. Reports nausea improved, asked for crackers.  Also reports 7/10 HA.  Excedrin given with relief. Ordered for 1L LR.  Pt able to tolerate PO now so will also do PO water ontop of pIVF. Went in shortly before fluids finished and patient states she is much better and ready for d/c. Stable for d/c.   A/P: #[redacted] weeks gestation #Hyperemesis gravidarum   - sent with rx for Zofran  ODTs and PO Robinul    Discharge from MAU in stable condition with strict/usual precautions Follow up at [facility] as scheduled for ongoing prenatal care  Allergies as of 10/03/2023   No Known Allergies      Medication List     TAKE these medications    aspirin  EC 81 MG  tablet Take 1 tablet (81 mg total) by mouth at bedtime. Start taking when you are [redacted] weeks pregnant for rest of pregnancy for prevention of preeclampsia   cyclobenzaprine  5 MG tablet Commonly known as: FLEXERIL  Take 1-2 tablets (5-10 mg total) by mouth 3 (three) times daily as needed for muscle spasms.   ondansetron  4 MG disintegrating tablet Commonly known as: ZOFRAN -ODT Take 1 tablet (4 mg total) by mouth every 8 (eight) hours as needed for nausea or vomiting.   Prenatal Complete 14-0.4 MG Tabs Take 1 tablet by mouth daily.        Yolanda Goldstein, MD 10/03/2023 2:34 PM

## 2023-10-03 NOTE — Discharge Instructions (Signed)

## 2023-10-03 NOTE — MAU Note (Signed)
 Yolanda Dunn is a 23 y.o. at [redacted]w[redacted]d here in MAU reporting: she's unable to drink or eat without it "coming back up"  and if she doesn't eat "it's just stomach acid.  Reports she doesn't have any prescribed meds for N/V.  Also states she's light headed Denies VB or LOF.  Endorses +FM.  LMP: NA Onset of complaint: 2 days ago Pain score: 4 Vitals:   10/03/23 1155  BP: 118/63  Pulse: 68  Resp: 18  Temp: 98.1 F (36.7 C)  SpO2: 100%     FHT:144 bpm Lab orders placed from triage: UA

## 2023-10-03 NOTE — MAU Note (Signed)
 Patient reports one episode of one drop of fluid approximately one week ago. Fern test negative.

## 2023-10-09 ENCOUNTER — Ambulatory Visit (INDEPENDENT_AMBULATORY_CARE_PROVIDER_SITE_OTHER): Payer: Medicaid Other | Admitting: Certified Nurse Midwife

## 2023-10-09 ENCOUNTER — Other Ambulatory Visit: Payer: Medicaid Other

## 2023-10-09 ENCOUNTER — Other Ambulatory Visit: Payer: Self-pay

## 2023-10-09 VITALS — BP 138/69 | HR 59 | Wt 140.1 lb

## 2023-10-09 DIAGNOSIS — Z23 Encounter for immunization: Secondary | ICD-10-CM

## 2023-10-09 DIAGNOSIS — Z3492 Encounter for supervision of normal pregnancy, unspecified, second trimester: Secondary | ICD-10-CM | POA: Diagnosis not present

## 2023-10-09 DIAGNOSIS — Z3A28 28 weeks gestation of pregnancy: Secondary | ICD-10-CM

## 2023-10-09 DIAGNOSIS — O219 Vomiting of pregnancy, unspecified: Secondary | ICD-10-CM | POA: Diagnosis not present

## 2023-10-09 NOTE — Progress Notes (Signed)
   PRENATAL VISIT NOTE  Subjective:  Yolanda Dunn is a 23 y.o. G1P0000 at [redacted]w[redacted]d being seen today for ongoing prenatal care.  She is currently monitored for the following issues for this low-risk pregnancy and has Adjustment disorder with depressed mood; Supervision of low-risk pregnancy; and Sinus arrhythmia seen on electrocardiogram on their problem list.  Patient reports no complaints.  Contractions: Not present. Vag. Bleeding: None.  Movement: Present. Denies leaking of fluid.   The following portions of the patient's history were reviewed and updated as appropriate: allergies, current medications, past family history, past medical history, past social history, past surgical history and problem list.   Objective:   Vitals:   10/09/23 0930  BP: 138/69  Pulse: (!) 59  Weight: 140 lb 1.6 oz (63.5 kg)    Fetal Status: Fetal Heart Rate (bpm): 146 Fundal Height: 28 cm Movement: Present     General:  Alert, oriented and cooperative. Patient is in no acute distress.  Skin: Skin is warm and dry. No rash noted.   Cardiovascular: Normal heart rate noted  Respiratory: Normal respiratory effort, no problems with respiration noted  Abdomen: Soft, gravid, appropriate for gestational age.  Pain/Pressure: Absent     Pelvic: Cervical exam deferred        Extremities: Normal range of motion.  Edema: None  Mental Status: Normal mood and affect. Normal behavior. Normal judgment and thought content.   Assessment and Plan:  Pregnancy: G1P0000 at [redacted]w[redacted]d 1. Encounter for supervision of low-risk pregnancy in second trimester (Primary) - Patient doing well.  - Reports vigorous and frequent fetal movement   2. [redacted] weeks gestation of pregnancy - TdaP Today, Declined flu and RSV  - GTT today  - Reviewed changes in prenatal schedule   3. Nausea and vomiting during pregnancy - Reports significant improvement   Preterm labor symptoms and general obstetric precautions including but not limited to  vaginal bleeding, contractions, leaking of fluid and fetal movement were reviewed in detail with the patient. Please refer to After Visit Summary for other counseling recommendations.   Return in about 2 weeks (around 10/23/2023) for LOB.  No future appointments.  Kemp Gomes Danella Deis) Suzie Portela, MSN, CNM  Center for [facility]  10/09/2023 10:00 AM

## 2023-10-10 ENCOUNTER — Encounter: Payer: Self-pay | Admitting: Obstetrics & Gynecology

## 2023-10-10 LAB — CBC
Hematocrit: 33.7 % — ABNORMAL LOW (ref 34.0–46.6)
Hemoglobin: 11 g/dL — ABNORMAL LOW (ref 11.1–15.9)
MCH: 31.8 pg (ref 26.6–33.0)
MCHC: 32.6 g/dL (ref 31.5–35.7)
MCV: 97 fL (ref 79–97)
Platelets: 181 10*3/uL (ref 150–450)
RBC: 3.46 x10E6/uL — ABNORMAL LOW (ref 3.77–5.28)
RDW: 11.6 % — ABNORMAL LOW (ref 11.7–15.4)
WBC: 7.2 10*3/uL (ref 3.4–10.8)

## 2023-10-10 LAB — GLUCOSE TOLERANCE, 2 HOURS W/ 1HR
Glucose, 1 hour: 130 mg/dL (ref 70–179)
Glucose, 2 hour: 95 mg/dL (ref 70–152)
Glucose, Fasting: 86 mg/dL (ref 70–91)

## 2023-10-10 LAB — HIV ANTIBODY (ROUTINE TESTING W REFLEX): HIV Screen 4th Generation wRfx: NONREACTIVE

## 2023-10-10 LAB — RPR: RPR Ser Ql: NONREACTIVE

## 2023-10-23 ENCOUNTER — Other Ambulatory Visit: Payer: Self-pay

## 2023-10-23 ENCOUNTER — Ambulatory Visit: Payer: Medicaid Other | Admitting: Obstetrics and Gynecology

## 2023-10-23 VITALS — BP 105/60 | HR 69 | Wt 139.0 lb

## 2023-10-23 DIAGNOSIS — R112 Nausea with vomiting, unspecified: Secondary | ICD-10-CM

## 2023-10-23 DIAGNOSIS — Z3492 Encounter for supervision of normal pregnancy, unspecified, second trimester: Secondary | ICD-10-CM | POA: Diagnosis not present

## 2023-10-23 DIAGNOSIS — Z3A30 30 weeks gestation of pregnancy: Secondary | ICD-10-CM

## 2023-10-23 MED ORDER — PROMETHAZINE HCL 25 MG PO TABS: 25.0000 mg | ORAL_TABLET | Freq: Four times a day (QID) | ORAL | 2 refills | Status: DC | PRN

## 2023-10-23 NOTE — Progress Notes (Signed)
   PRENATAL VISIT NOTE  Subjective:  Yolanda Dunn is a 23 y.o. G1P0000 at [redacted]w[redacted]d being seen today for ongoing prenatal care.  She is currently monitored for the following issues for this low-risk pregnancy and has Adjustment disorder with depressed mood; Supervision of low-risk pregnancy; and Sinus arrhythmia seen on electrocardiogram on their problem list.  Patient reports  nausea and vomiting, zofran  not helping as much  .  Contractions: Not present. Vag. Bleeding: None.  Movement: Present. Denies leaking of fluid.   The following portions of the patient's history were reviewed and updated as appropriate: allergies, current medications, past family history, past medical history, past social history, past surgical history and problem list.   Objective:   Vitals:   10/23/23 1026  BP: 105/60  Pulse: 69  Weight: 139 lb (63 kg)    Fetal Status: Fetal Heart Rate (bpm): 135   Movement: Present     General:  Alert, oriented and cooperative. Patient is in no acute distress.  Skin: Skin is warm and dry. No rash noted.   Cardiovascular: Normal heart rate noted  Respiratory: Normal respiratory effort, no problems with respiration noted  Abdomen: Soft, gravid, appropriate for gestational age.  Pain/Pressure: Absent     Pelvic: Cervical exam deferred        Extremities: Normal range of motion.  Edema: None  Mental Status: Normal mood and affect. Normal behavior. Normal judgment and thought content.   Assessment and Plan:  Pregnancy: G1P0000 at [redacted]w[redacted]d 1. Encounter for supervision of low-risk pregnancy in second trimester (Primary) BP and FHR normal Doing well, feeling regular movement    2. [redacted] weeks gestation of pregnancy Would like the option to use squat for deliver, discussed option for squat bar at hospital, does not desire epidural   3. Nausea and vomiting, unspecified vomiting type Trial of phenergan   - promethazine  (PHENERGAN ) 25 MG tablet; Take 1 tablet (25 mg total) by mouth  every 6 (six) hours as needed for nausea or vomiting.  Dispense: 30 tablet; Refill: 2   Preterm labor symptoms and general obstetric precautions including but not limited to vaginal bleeding, contractions, leaking of fluid and fetal movement were reviewed in detail with the patient. Please refer to After Visit Summary for other counseling recommendations.   Return in about 2 weeks (around 11/06/2023) for OB VISIT (MD or APP).   Nidia Daring, FNP

## 2023-10-23 NOTE — Patient Instructions (Signed)

## 2023-11-08 ENCOUNTER — Telehealth: Payer: Self-pay | Admitting: Family Medicine

## 2023-11-08 ENCOUNTER — Encounter: Payer: Medicaid Other | Admitting: Obstetrics and Gynecology

## 2023-11-08 ENCOUNTER — Encounter: Payer: Self-pay | Admitting: Obstetrics and Gynecology

## 2023-11-08 NOTE — Progress Notes (Signed)
 Patient did not keep her routine OB appointment for 11/08/2023.  Cornelia Copa MD Attending Center for [employer] [facility])

## 2023-11-08 NOTE — Telephone Encounter (Signed)
 Called patient to let her know about her appt being rescheduled

## 2023-11-15 ENCOUNTER — Encounter: Payer: Self-pay | Admitting: Certified Nurse Midwife

## 2023-11-22 ENCOUNTER — Other Ambulatory Visit: Payer: Self-pay

## 2023-11-22 ENCOUNTER — Ambulatory Visit: Payer: Medicaid Other | Admitting: Obstetrics & Gynecology

## 2023-11-22 VITALS — BP 144/89 | HR 91 | Wt 138.0 lb

## 2023-11-22 DIAGNOSIS — Z3492 Encounter for supervision of normal pregnancy, unspecified, second trimester: Secondary | ICD-10-CM | POA: Diagnosis not present

## 2023-11-22 DIAGNOSIS — Z3A34 34 weeks gestation of pregnancy: Secondary | ICD-10-CM | POA: Diagnosis not present

## 2023-11-22 DIAGNOSIS — R030 Elevated blood-pressure reading, without diagnosis of hypertension: Secondary | ICD-10-CM

## 2023-11-22 DIAGNOSIS — Z3493 Encounter for supervision of normal pregnancy, unspecified, third trimester: Secondary | ICD-10-CM

## 2023-11-22 NOTE — Progress Notes (Signed)
   PRENATAL VISIT NOTE  Subjective:  Yolanda Dunn is a 23 y.o. G1P0000 at [redacted]w[redacted]d being seen today for ongoing prenatal care.  She is currently monitored for the following issues for this high-risk pregnancy and has Adjustment disorder with depressed mood; Supervision of low-risk pregnancy; and Sinus arrhythmia seen on electrocardiogram on their problem list.  Patient reports no complaints.  Contractions: Not present. Vag. Bleeding: None.  Movement: Present. Denies leaking of fluid.   The following portions of the patient's history were reviewed and updated as appropriate: allergies, current medications, past family history, past medical history, past social history, past surgical history and problem list.   Objective:   Vitals:   11/22/23 1328 11/22/23 1346  BP: (!) 151/98 (!) 144/89  Pulse: 88 91  Weight: 138 lb (62.6 kg)     Fetal Status: Fetal Heart Rate (bpm): 144   Movement: Present     General:  Alert, oriented and cooperative. Patient is in no acute distress.  Skin: Skin is warm and dry. No rash noted.   Cardiovascular: Normal heart rate noted  Respiratory: Normal respiratory effort, no problems with respiration noted  Abdomen: Soft, gravid, appropriate for gestational age.  Pain/Pressure: Absent     Pelvic: Cervical exam deferred        Extremities: Normal range of motion.  Edema: None  Mental Status: Normal mood and affect. Normal behavior. Normal judgment and thought content.   Assessment and Plan:  Pregnancy: G1P0000 at [redacted]w[redacted]d 1. [redacted] weeks gestation of pregnancy (Primary) Early signs of GHTN  2. Encounter for supervision of low-risk pregnancy in second trimester  - Protein / creatinine ratio, urine - CBC - Comp Met (CMET) - [place] MFM OB FOLLOW UP; Future  Preterm labor symptoms and general obstetric precautions including but not limited to vaginal bleeding, contractions, leaking of fluid and fetal movement were reviewed in detail with the patient. Please refer to  After Visit Summary for other counseling recommendations.  PreE instructions Return in about 1 week (around 11/29/2023). F/u labs No future appointments.  Scheryl Darter, MD

## 2023-11-23 LAB — COMPREHENSIVE METABOLIC PANEL
ALT: 7 IU/L (ref 0–32)
AST: 17 IU/L (ref 0–40)
Albumin: 3.9 g/dL — ABNORMAL LOW (ref 4.0–5.0)
Alkaline Phosphatase: 178 IU/L — ABNORMAL HIGH (ref 44–121)
BUN/Creatinine Ratio: 8 — ABNORMAL LOW (ref 9–23)
BUN: 4 mg/dL — ABNORMAL LOW (ref 6–20)
Bilirubin Total: 0.4 mg/dL (ref 0.0–1.2)
CO2: 19 mmol/L — ABNORMAL LOW (ref 20–29)
Calcium: 9.2 mg/dL (ref 8.7–10.2)
Chloride: 103 mmol/L (ref 96–106)
Creatinine, Ser: 0.5 mg/dL — ABNORMAL LOW (ref 0.57–1.00)
Globulin, Total: 2.7 g/dL (ref 1.5–4.5)
Glucose: 75 mg/dL (ref 70–99)
Potassium: 4.5 mmol/L (ref 3.5–5.2)
Sodium: 135 mmol/L (ref 134–144)
Total Protein: 6.6 g/dL (ref 6.0–8.5)
eGFR: 135 mL/min/{1.73_m2} (ref >59)

## 2023-11-23 LAB — CBC
Hematocrit: 38.1 % (ref 34.0–46.6)
Hemoglobin: 12.4 g/dL (ref 11.1–15.9)
MCH: 30.7 pg (ref 26.6–33.0)
MCHC: 32.5 g/dL (ref 31.5–35.7)
MCV: 94 fL (ref 79–97)
Platelets: 161 10*3/uL (ref 150–450)
RBC: 4.04 x10E6/uL (ref 3.77–5.28)
RDW: 12.1 % (ref 11.7–15.4)
WBC: 6.2 10*3/uL (ref 3.4–10.8)

## 2023-12-04 ENCOUNTER — Encounter: Admitting: Obstetrics and Gynecology

## 2023-12-04 ENCOUNTER — Other Ambulatory Visit: Payer: Self-pay

## 2023-12-04 ENCOUNTER — Telehealth: Payer: Self-pay

## 2023-12-04 ENCOUNTER — Other Ambulatory Visit: Payer: Self-pay | Admitting: Obstetrics & Gynecology

## 2023-12-04 ENCOUNTER — Ambulatory Visit

## 2023-12-04 ENCOUNTER — Ambulatory Visit: Attending: Obstetrics & Gynecology

## 2023-12-04 DIAGNOSIS — O365931 Maternal care for other known or suspected poor fetal growth, third trimester, fetus 1: Secondary | ICD-10-CM

## 2023-12-04 DIAGNOSIS — O365939 Maternal care for other known or suspected poor fetal growth, third trimester, other fetus: Secondary | ICD-10-CM | POA: Insufficient documentation

## 2023-12-04 DIAGNOSIS — Z3492 Encounter for supervision of normal pregnancy, unspecified, second trimester: Secondary | ICD-10-CM | POA: Diagnosis not present

## 2023-12-04 DIAGNOSIS — Z3A36 36 weeks gestation of pregnancy: Secondary | ICD-10-CM | POA: Diagnosis not present

## 2023-12-04 DIAGNOSIS — O133 Gestational [pregnancy-induced] hypertension without significant proteinuria, third trimester: Secondary | ICD-10-CM | POA: Diagnosis not present

## 2023-12-04 DIAGNOSIS — R030 Elevated blood-pressure reading, without diagnosis of hypertension: Secondary | ICD-10-CM

## 2023-12-04 DIAGNOSIS — Z3490 Encounter for supervision of normal pregnancy, unspecified, unspecified trimester: Secondary | ICD-10-CM

## 2023-12-04 LAB — PROTEIN / CREATININE RATIO, URINE

## 2023-12-04 NOTE — Telephone Encounter (Signed)
 Rep called from [facility] requesting recollection from patient for protein / creatinine ratio due to processing error.   B'Aisha, CMA

## 2023-12-05 ENCOUNTER — Encounter: Payer: Self-pay | Admitting: *Deleted

## 2023-12-05 NOTE — Addendum Note (Signed)
 Addended by: Gerome Apley on: 12/05/2023 10:27 AM   Modules accepted: Orders

## 2023-12-05 NOTE — Telephone Encounter (Signed)
 I called patient to schedule lab appointment for her to give urine for protein/ creatinine and reached her voicemail. I left a message I am calling to schedule an appointment and will send a detailed MyChart message for you to respond to or call. Will leave in inbox to see if patient responds or if we need to call her again. Nancy Fetter

## 2023-12-06 ENCOUNTER — Other Ambulatory Visit: Payer: Self-pay | Admitting: *Deleted

## 2023-12-06 DIAGNOSIS — O365930 Maternal care for other known or suspected poor fetal growth, third trimester, not applicable or unspecified: Secondary | ICD-10-CM

## 2023-12-07 ENCOUNTER — Other Ambulatory Visit

## 2023-12-07 DIAGNOSIS — O365990 Maternal care for other known or suspected poor fetal growth, unspecified trimester, not applicable or unspecified: Secondary | ICD-10-CM | POA: Insufficient documentation

## 2023-12-07 NOTE — Telephone Encounter (Signed)
 Patient has e-checked in for appointment this morning to recollect Pro/Cr ratio.

## 2023-12-10 ENCOUNTER — Ambulatory Visit

## 2023-12-10 ENCOUNTER — Other Ambulatory Visit

## 2023-12-10 ENCOUNTER — Ambulatory Visit (HOSPITAL_BASED_OUTPATIENT_CLINIC_OR_DEPARTMENT_OTHER): Admitting: Obstetrics

## 2023-12-10 ENCOUNTER — Ambulatory Visit: Attending: Maternal & Fetal Medicine | Admitting: *Deleted

## 2023-12-10 VITALS — BP 120/59 | HR 53

## 2023-12-10 DIAGNOSIS — O133 Gestational [pregnancy-induced] hypertension without significant proteinuria, third trimester: Secondary | ICD-10-CM | POA: Insufficient documentation

## 2023-12-10 DIAGNOSIS — R030 Elevated blood-pressure reading, without diagnosis of hypertension: Secondary | ICD-10-CM

## 2023-12-10 DIAGNOSIS — Z3A37 37 weeks gestation of pregnancy: Secondary | ICD-10-CM | POA: Insufficient documentation

## 2023-12-10 DIAGNOSIS — Z362 Encounter for other antenatal screening follow-up: Secondary | ICD-10-CM | POA: Insufficient documentation

## 2023-12-10 DIAGNOSIS — O26893 Other specified pregnancy related conditions, third trimester: Secondary | ICD-10-CM | POA: Diagnosis not present

## 2023-12-10 DIAGNOSIS — O365931 Maternal care for other known or suspected poor fetal growth, third trimester, fetus 1: Secondary | ICD-10-CM | POA: Diagnosis present

## 2023-12-10 DIAGNOSIS — O365930 Maternal care for other known or suspected poor fetal growth, third trimester, not applicable or unspecified: Secondary | ICD-10-CM

## 2023-12-10 DIAGNOSIS — Z3490 Encounter for supervision of normal pregnancy, unspecified, unspecified trimester: Secondary | ICD-10-CM

## 2023-12-10 NOTE — Progress Notes (Unsigned)
   PRENATAL VISIT NOTE  Subjective:  Yolanda Dunn is a 23 y.o. G1P0000 at [redacted]w[redacted]d being seen today for ongoing prenatal care.  She is currently monitored for the following issues for this {Blank single:19197::"high-risk","low-risk"} pregnancy and has Adjustment disorder with depressed mood; Supervision of low-risk pregnancy; Sinus arrhythmia seen on electrocardiogram; and IUGR (intrauterine growth restriction) affecting care of mother on their problem list.  Patient reports {sx:14538}.   .  .   . Denies leaking of fluid.   The following portions of the patient's history were reviewed and updated as appropriate: allergies, current medications, past family history, past medical history, past social history, past surgical history and problem list.   Objective:  There were no vitals filed for this visit.  Fetal Status:           General:  Alert, oriented and cooperative. Patient is in no acute distress.  Skin: Skin is warm and dry. No rash noted.   Cardiovascular: Normal heart rate noted  Respiratory: Normal respiratory effort, no problems with respiration noted  Abdomen: Soft, gravid, appropriate for gestational age.        Pelvic: {Blank single:19197::"Cervical exam performed in the presence of a chaperone","Cervical exam deferred"}        Extremities: Normal range of motion.     Mental Status: Normal mood and affect. Normal behavior. Normal judgment and thought content.   Assessment and Plan:  Pregnancy: G1P0000 at [redacted]w[redacted]d 1. Encounter for supervision of low-risk pregnancy, antepartum (Primary) ***  2. [redacted] weeks gestation of pregnancy ***  {Blank single:19197::"Term","Preterm"} labor symptoms and general obstetric precautions including but not limited to vaginal bleeding, contractions, leaking of fluid and fetal movement were reviewed in detail with the patient. Please refer to After Visit Summary for other counseling recommendations.   Return in about 1 week (around 12/18/2023) for  LOB.  Future Appointments  Date Time Provider Department Center  12/10/2023 11:15 AM [facility] NURSE [facility]  12/10/2023 11:30 AM [facility] US6 [facility]  12/10/2023  1:50 PM [facility] LAB [facility]  12/11/2023  2:15 PM [place], Clarita Crane, CNM [facility]    Richardson Landry, CNM

## 2023-12-10 NOTE — Progress Notes (Signed)
 MFM Consult Note  Yolanda Dunn is currently at 37 weeks and 3 days.  She has been followed due to IUGR.  She denies any problems since her last exam and reports feeling fetal movements throughout the day.  A BPP performed today was 8 out of 8.  Borderline polyhydramnios with a total AFI of 24.34 cm is noted.  Doppler studies of the umbilical arteries performed due to fetal growth restriction showed an elevated S/D ratio of 3.65.  There were no signs of absent or reversed end-diastolic flow noted today.  The patient was reassured that most cases of IUGR result in the delivery of a healthy infant at or close to term. The increased risk of an adverse pregnancy outcome such as stillbirth associated with IUGR was discussed.  Due to IUGR with normal fetal testing, delivery is recommended at between 38 to 39 weeks (next week).    The patient will discuss scheduling an induction with you during her next prenatal visit tomorrow.    The patient and her partner stated that all of their questions were answered today.    No further exams were scheduled in our office.  A total of 20 minutes was spent counseling and coordinating the care for this patient.  Greater than 50% of the time was spent in direct face-to-face contact.

## 2023-12-11 ENCOUNTER — Other Ambulatory Visit

## 2023-12-11 ENCOUNTER — Ambulatory Visit: Admitting: Certified Nurse Midwife

## 2023-12-11 ENCOUNTER — Other Ambulatory Visit: Payer: Self-pay

## 2023-12-11 ENCOUNTER — Ambulatory Visit

## 2023-12-11 VITALS — BP 126/70 | HR 70 | Wt 144.1 lb

## 2023-12-11 DIAGNOSIS — Z3A37 37 weeks gestation of pregnancy: Secondary | ICD-10-CM | POA: Diagnosis not present

## 2023-12-11 DIAGNOSIS — Z3490 Encounter for supervision of normal pregnancy, unspecified, unspecified trimester: Secondary | ICD-10-CM

## 2023-12-11 DIAGNOSIS — O365930 Maternal care for other known or suspected poor fetal growth, third trimester, not applicable or unspecified: Secondary | ICD-10-CM | POA: Diagnosis not present

## 2023-12-11 NOTE — Patient Instructions (Signed)
 Things to Try After 37 weeks to Encourage Labor/Get Ready for Labor:    Try the [employer] at [email or web address].com/.com daily to improve baby's position and encourage the onset of labor.  Walk a little and rest a little every day.  Change positions often.  Cervical Ripening: May try one or both Red Raspberry Leaf capsules or tea:  two 300mg  or 400mg  tablets with each meal, 2-3 times a day, or 1-3 cups of tea daily  Potential Side Effects Of Raspberry Leaf:  Most women do not experience any side effects from drinking raspberry leaf tea. However, nausea and loose stools are possible.  Evening Primrose Oil capsules: take 1 capsule by mouth and place one capsule in the vagina every night.    Some of the potential side effects:  Upset stomach  Loose stools or diarrhea  Headaches  Nausea  Sex can also help the cervix ripen and encourage labor onset.  5. Eating 6-8 dates per day can help soften and even dilate your cervix. You may eat them plain, in smoothies, or in energy balls.  They do contain sugar so eat with caution and balance with protein.

## 2023-12-12 ENCOUNTER — Telehealth (HOSPITAL_COMMUNITY): Payer: Self-pay | Admitting: *Deleted

## 2023-12-12 ENCOUNTER — Encounter (HOSPITAL_COMMUNITY): Payer: Self-pay

## 2023-12-12 LAB — PROTEIN / CREATININE RATIO, URINE
Creatinine, Urine: 58.3 mg/dL
Protein, Ur: 10.9 mg/dL
Protein/Creat Ratio: 187 mg/g{creat} (ref 0–200)

## 2023-12-12 NOTE — Telephone Encounter (Signed)
 Preadmission screen

## 2023-12-13 ENCOUNTER — Telehealth (HOSPITAL_COMMUNITY): Payer: Self-pay | Admitting: *Deleted

## 2023-12-13 NOTE — Telephone Encounter (Signed)
 Preadmission screen

## 2023-12-14 ENCOUNTER — Telehealth (HOSPITAL_COMMUNITY): Payer: Self-pay | Admitting: *Deleted

## 2023-12-14 ENCOUNTER — Encounter (HOSPITAL_COMMUNITY): Payer: Self-pay | Admitting: *Deleted

## 2023-12-14 NOTE — Telephone Encounter (Signed)
 Preadmission screen
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
## 2023-12-15 ENCOUNTER — Inpatient Hospital Stay (HOSPITAL_COMMUNITY)
Admission: RE | Admit: 2023-12-15 | Discharge: 2023-12-17 | DRG: 807 | Disposition: A | Discharge status: Home / Self Care | Payer: Medicaid Other | Attending: Obstetrics & Gynecology | Admitting: Obstetrics & Gynecology

## 2023-12-15 ENCOUNTER — Inpatient Hospital Stay (HOSPITAL_COMMUNITY)

## 2023-12-15 ENCOUNTER — Encounter (HOSPITAL_COMMUNITY): Payer: Self-pay | Admitting: Family Medicine

## 2023-12-15 DIAGNOSIS — Z3A38 38 weeks gestation of pregnancy: Secondary | ICD-10-CM

## 2023-12-15 DIAGNOSIS — O365930 Maternal care for other known or suspected poor fetal growth, third trimester, not applicable or unspecified: Secondary | ICD-10-CM | POA: Diagnosis not present

## 2023-12-15 DIAGNOSIS — Z3A00 Weeks of gestation of pregnancy not specified: Secondary | ICD-10-CM | POA: Diagnosis not present

## 2023-12-15 DIAGNOSIS — O43899 Other placental disorders, unspecified trimester: Secondary | ICD-10-CM | POA: Diagnosis not present

## 2023-12-15 DIAGNOSIS — Z3490 Encounter for supervision of normal pregnancy, unspecified, unspecified trimester: Principal | ICD-10-CM | POA: Diagnosis present

## 2023-12-15 LAB — CBC
HCT: 34.3 % — ABNORMAL LOW (ref 36.0–46.0)
Hemoglobin: 11.5 g/dL — ABNORMAL LOW (ref 12.0–15.0)
MCH: 31.5 pg (ref 26.0–34.0)
MCHC: 33.5 g/dL (ref 30.0–36.0)
MCV: 94 fL (ref 80.0–100.0)
Platelets: 174 10*3/uL (ref 150–400)
RBC: 3.65 MIL/uL — ABNORMAL LOW (ref 3.87–5.11)
RDW: 13.7 % (ref 11.5–15.5)
WBC: 10.7 10*3/uL — ABNORMAL HIGH (ref 4.0–10.5)
nRBC: 0 % (ref 0.0–0.2)

## 2023-12-15 LAB — TYPE AND SCREEN
ABO/RH(D): O POS
Antibody Screen: NEGATIVE

## 2023-12-15 LAB — RPR: RPR Ser Ql: NONREACTIVE

## 2023-12-15 LAB — GROUP B STREP BY PCR: Group B strep by PCR: NEGATIVE

## 2023-12-15 MED ORDER — SIMETHICONE 80 MG PO CHEW: 80.0000 mg | CHEWABLE_TABLET | ORAL | Status: DC | PRN

## 2023-12-15 MED ORDER — ONDANSETRON HCL 4 MG/2ML IJ SOLN: 4.0000 mg | INTRAMUSCULAR | Status: DC | PRN

## 2023-12-15 MED ORDER — LACTATED RINGERS IV SOLN: INTRAVENOUS | Status: DC

## 2023-12-15 MED ORDER — OXYTOCIN-SODIUM CHLORIDE 30-0.9 UT/500ML-% IV SOLN
2.5000 [IU]/h | INTRAVENOUS | Status: DC
  Administered 2023-12-15: 2.5 [IU]/h via INTRAVENOUS
  Filled 2023-12-15: qty 500

## 2023-12-15 MED ORDER — LIDOCAINE HCL (PF) 1 % IJ SOLN: 30.0000 mL | INTRAMUSCULAR | Status: DC | PRN

## 2023-12-15 MED ORDER — COCONUT OIL OIL: 1.0000 | TOPICAL_OIL | Status: DC | PRN

## 2023-12-15 MED ORDER — ONDANSETRON HCL 4 MG/2ML IJ SOLN
4.0000 mg | Freq: Four times a day (QID) | INTRAMUSCULAR | Status: DC | PRN
  Administered 2023-12-15: 4 mg via INTRAVENOUS
  Filled 2023-12-15: qty 2

## 2023-12-15 MED ORDER — FENTANYL CITRATE (PF) 100 MCG/2ML IJ SOLN
100.0000 ug | INTRAMUSCULAR | Status: DC | PRN
  Administered 2023-12-15 (×2): 100 ug via INTRAVENOUS
  Filled 2023-12-15 (×2): qty 2

## 2023-12-15 MED ORDER — OXYCODONE-ACETAMINOPHEN 5-325 MG PO TABS: 1.0000 | ORAL_TABLET | ORAL | Status: DC | PRN

## 2023-12-15 MED ORDER — SENNOSIDES-DOCUSATE SODIUM 8.6-50 MG PO TABS
2.0000 | ORAL_TABLET | Freq: Every day | ORAL | Status: DC
  Administered 2023-12-16 – 2023-12-17 (×2): 2 via ORAL
  Filled 2023-12-15 (×2): qty 2

## 2023-12-15 MED ORDER — ACETAMINOPHEN 325 MG PO TABS: 650.0000 mg | ORAL_TABLET | ORAL | Status: DC | PRN

## 2023-12-15 MED ORDER — MISOPROSTOL 25 MCG QUARTER TABLET
25.0000 ug | ORAL_TABLET | ORAL | Status: DC | PRN
  Filled 2023-12-15: qty 1

## 2023-12-15 MED ORDER — TERBUTALINE SULFATE 1 MG/ML IJ SOLN
0.2500 mg | Freq: Once | INTRAMUSCULAR | Status: AC | PRN
  Administered 2023-12-15: 0.25 mg via SUBCUTANEOUS
  Filled 2023-12-15: qty 1

## 2023-12-15 MED ORDER — SODIUM CHLORIDE 0.9% FLUSH: 3.0000 mL | INTRAVENOUS | Status: DC | PRN

## 2023-12-15 MED ORDER — OXYTOCIN BOLUS FROM INFUSION
333.0000 mL | Freq: Once | INTRAVENOUS | Status: AC
  Administered 2023-12-15: 333 mL via INTRAVENOUS

## 2023-12-15 MED ORDER — OXYCODONE-ACETAMINOPHEN 5-325 MG PO TABS: 2.0000 | ORAL_TABLET | ORAL | Status: DC | PRN

## 2023-12-15 MED ORDER — ONDANSETRON HCL 4 MG PO TABS: 4.0000 mg | ORAL_TABLET | ORAL | Status: DC | PRN

## 2023-12-15 MED ORDER — PRENATAL MULTIVITAMIN CH
1.0000 | ORAL_TABLET | Freq: Every day | ORAL | Status: DC
  Administered 2023-12-16 – 2023-12-17 (×2): 1 via ORAL
  Filled 2023-12-15 (×2): qty 1

## 2023-12-15 MED ORDER — IBUPROFEN 600 MG PO TABS
600.0000 mg | ORAL_TABLET | Freq: Four times a day (QID) | ORAL | Status: DC
  Administered 2023-12-15 – 2023-12-17 (×8): 600 mg via ORAL
  Filled 2023-12-15 (×8): qty 1

## 2023-12-15 MED ORDER — DIPHENHYDRAMINE HCL 25 MG PO CAPS: 25.0000 mg | ORAL_CAPSULE | Freq: Four times a day (QID) | ORAL | Status: DC | PRN

## 2023-12-15 MED ORDER — TETANUS-DIPHTH-ACELL PERTUSSIS 5-2.5-18.5 LF-MCG/0.5 IM SUSY: 0.5000 mL | PREFILLED_SYRINGE | Freq: Once | INTRAMUSCULAR | Status: DC

## 2023-12-15 MED ORDER — WITCH HAZEL-GLYCERIN EX PADS: 1.0000 | MEDICATED_PAD | CUTANEOUS | Status: DC | PRN

## 2023-12-15 MED ORDER — LACTATED RINGERS IV SOLN
500.0000 mL | INTRAVENOUS | Status: DC | PRN
Start: 2023-12-15 — End: 2023-12-15

## 2023-12-15 MED ORDER — MISOPROSTOL 50MCG HALF TABLET
50.0000 ug | ORAL_TABLET | Freq: Once | ORAL | Status: AC
  Administered 2023-12-15: 50 ug via BUCCAL
  Filled 2023-12-15: qty 1

## 2023-12-15 MED ORDER — HYDROXYZINE HCL 50 MG PO TABS: 50.0000 mg | ORAL_TABLET | Freq: Four times a day (QID) | ORAL | Status: DC | PRN

## 2023-12-15 MED ORDER — BENZOCAINE-MENTHOL 20-0.5 % EX AERO: 1.0000 | INHALATION_SPRAY | CUTANEOUS | Status: DC | PRN

## 2023-12-15 MED ORDER — SODIUM CHLORIDE 0.9% FLUSH: 3.0000 mL | Freq: Two times a day (BID) | INTRAVENOUS | Status: DC

## 2023-12-15 MED ORDER — SODIUM CHLORIDE 0.9 % IV SOLN: 250.0000 mL | INTRAVENOUS | Status: DC | PRN

## 2023-12-15 MED ORDER — DIBUCAINE (PERIANAL) 1 % EX OINT: 1.0000 | TOPICAL_OINTMENT | CUTANEOUS | Status: DC | PRN

## 2023-12-15 MED ORDER — SOD CITRATE-CITRIC ACID 500-334 MG/5ML PO SOLN: 30.0000 mL | ORAL | Status: DC | PRN

## 2023-12-15 NOTE — Progress Notes (Signed)
 Yolanda Dunn is a 23 y.o. G1P0000 at [redacted]w[redacted]d by ultrasound admitted for induction of labor due to FGR with elevated dopplers. .  Subjective: Patient resting well.   Objective: BP 124/65   Pulse 63   Temp 98.9 F (37.2 C) (Oral)   Resp 16   Ht 5' (1.524 m)   Wt 65.3 kg   LMP 03/22/2023 (Exact Date)   BMI 28.12 kg/m  No intake/output data recorded. No intake/output data recorded.  FHT:   135bpm with moderate variability  UC:   regular, every 1-6 minutes SVE:   Dilation: 1 Effacement (%): 70 Station: -1 Exam by:: Shay CNM  Labs: Lab Results  Component Value Date   WBC 10.7 (H) 12/15/2023   HGB 11.5 (L) 12/15/2023   HCT 34.3 (L) 12/15/2023   MCV 94.0 12/15/2023   PLT 174 12/15/2023    Assessment / Plan: Induction of labor due to FGR,  progressed well s/p Buccal cytotec.   Labor:  FB placed with minimal difficulty. Membrane sweep performed as patient declined cytotec at this time. Once FB expelled assess for AROM and or Pit.  Fetal Wellbeing:  Category I Pain Control:  Labor support without medications I/D:   GBS  PRESUMPTIVE NEGATIVE  Anticipated MOD:  NSVD  Claudette Head, CNM 12/15/2023, 6:32 AM

## 2023-12-15 NOTE — Progress Notes (Signed)
 Patient ID: Yolanda Dunn, female   DOB: 2001/09/01, 23 y.o.   MRN: [id number] Yolanda Dunn is a 23 y.o. G1P0000 at [redacted]w[redacted]d.  Subjective: Pt resting, agreeable for SVE and foley balloon attempt.   Objective: BP 129/69   Pulse 78   Temp 98.9 F (37.2 C) (Oral)   Resp 16   Ht 5' (1.524 m)   Wt 65.3 kg   LMP 03/22/2023 (Exact Date)   BMI 28.12 kg/m    FHT:  FHR: 140 bpm, variability: moderate,  accelerations:  present,  decelerations:  none UC:   Q 2-6 minutes, irregular Dilation: 1.5 Effacement (%): 50 Station: -3 Presentation: Vertex Exam by:: Nechama Escutia SNM  Labs: Results for orders placed or performed during the hospital encounter of 12/15/23 (from the past 24 hours)  Type and screen     Status: None   Collection Time: 12/15/23 12:25 AM  Result Value Ref Range   ABO/RH(D) O POS    Antibody Screen NEG    Sample Expiration      12/18/2023,2359 Performed at [facility], [address], [place], [place] [address]   Group B strep by PCR     Status: None   Collection Time: 12/15/23 12:25 AM   Specimen: Vaginal/Rectal; Genital  Result Value Ref Range   Group B strep by PCR PRESUMPTIVE NEGATIVE PRESUMPTIVE NEGATIVE  CBC     Status: Abnormal   Collection Time: 12/15/23 12:30 AM  Result Value Ref Range   WBC 10.7 (H) 4.0 - 10.5 K/uL   RBC 3.65 (L) 3.87 - 5.11 MIL/uL   Hemoglobin 11.5 (L) 12.0 - 15.0 g/dL   HCT 62.1 (L) 30.8 - 65.7 %   MCV 94.0 80.0 - 100.0 fL   MCH 31.5 26.0 - 34.0 pg   MCHC 33.5 30.0 - 36.0 g/dL   RDW 84.6 96.2 - 95.2 %   Platelets 174 150 - 400 K/uL   nRBC 0.0 0.0 - 0.2 %    Assessment / Plan: [redacted]w[redacted]d week IUP IOL IUGR elevated doppler Labor: s/p 1 dose of buccal cytotec. SVE as above. Attempted foley balloon, but unsuccessful at this time. Pt agreeable to re-attempt in an hour.  Fetal Wellbeing:  Category 1 Pain Control:  prn, desires unmedicated birth Anticipated MOD:  SVD  Herminio Commons, Student-MidWife 12/15/2023 5:30 AM

## 2023-12-15 NOTE — Lactation Note (Signed)
 This note was copied from a baby's chart. Lactation Consultation Note  Patient Name: Yolanda Dunn [id number] Date: 12/15/2023 Age:24 hours Reason for consult: Initial assessment;Primapara;1st time breastfeeding;Exclusive pumping and bottle feeding;Early term 37-38.6wks  P1- MOB wants to exclusively pump only. RN set up MOB with DBM as requested by MOB. LC reviewed how she will have to switch over to formula once discharged until her milk comes in. MOB verbalized understanding. MOB was attempting to bottle feed infant with a yellow hospital throw away nipple. Infant was not interested with MOB, so LC offered to assist. LC placed infant in a side lying position. Infant did not do well with the yellow nipple. He started throwing up and had DBM spilling on the side of his mouth. LC believes he may still have fluid in his belly at this time. LC switched infant to a white Nfant nipple. Infant did better with this nipple, but still had some DBM spilling out the side. LC was able to bottle feed infant 8 mL in 20 minutes.  MOB was using her personal DEBP and reported no colostrum. LC reviewed how this is normal and offered to demonstrate hand expression. MOB consented. LC was able to express a small drop of colostrum with one expression. LC also set up the hospital DEBP and reviewed how to use it. MOB has both nipples pierced with a bar piercing. LC recommended removing these piercing when pumping. MOB reports that her right piercing is stuck, so she will try to soak it to loosen it. MOB needs to use size 18 mm flanges, but with the piercing, the 24 mm flanges are the only thing that fits.  LC reviewed the first 24 hr birthday nap, day 2 cluster feeding, feeding infant on cue 8-12x in 24 hrs, not allowing infant to go over 3 hrs without a feeding, CDC milk storage guidelines, LC services handout and engorgement/breast care. LC encouraged MOB to call for further assistance as needed. MOB reports wanting to stay in  the hospital for two days vs one to work on pumping. LC informed RN of MOB's request and how the consult went.  Maternal Data Has patient been taught Hand Expression?: Yes Does the patient have breastfeeding experience prior to this delivery?: No  Feeding Mother's Current Feeding Choice: Breast Milk and Donor Milk Nipple Type: Nfant Standard Flow (white)  Lactation Tools Discussed/Used Tools: Pump;Flanges Flange Size: 24 (Needs an 18 mm flange. See LC note) Breast pump type: Double-Electric Breast Pump;Manual Pump Education: Setup, frequency, and cleaning;Milk Storage Reason for Pumping: exclusive pumping Pumping frequency: 15-20 min every 3 hrs  Interventions Interventions: Breast feeding basics reviewed;Hand express;Expressed milk;Hand pump;DEBP;Education;Pace feeding;LC Services brochure  Discharge Discharge Education: Engorgement and breast care;Warning signs for feeding baby Pump: DEBP;Personal  Consult Status Consult Status: Follow-up Date: 12/16/23 Follow-up type: In-patient    Dema Severin BS, IBCLC 12/15/2023, 6:19 PM

## 2023-12-15 NOTE — Progress Notes (Signed)
 Patient ID: Yolanda Dunn, female   DOB: 11-01-2000, 23 y.o.   MRN: [id number] Yolanda Dunn is a 23 y.o. G1P0000 at [redacted]w[redacted]d.  Subjective: Pt in hands and knees position due to fetal tracing  Objective: BP 129/69   Pulse 78   Temp 98.9 F (37.2 C) (Oral)   Resp 16   Ht 5' (1.524 m)   Wt 65.3 kg   LMP 03/22/2023 (Exact Date)   BMI 28.12 kg/m    FHT:  FHR: 140 bpm, variability: moderate,  accelerations:  present,  decelerations:  variable and 7-8 minute prolonged with intermittent tracing due to position changes  UC:   Q 1-4 minutes, irregular with a period of tachysystole Dilation: 1 Effacement (%): 50 Presentation: Vertex Exam by:: Student midwife  Labs: Results for orders placed or performed during the hospital encounter of 12/15/23 (from the past 24 hours)  Type and screen     Status: None   Collection Time: 12/15/23 12:25 AM  Result Value Ref Range   ABO/RH(D) O POS    Antibody Screen NEG    Sample Expiration      12/18/2023,2359 Performed at [facility], [address] [place], [place] [address]   Group B strep by PCR     Status: None   Collection Time: 12/15/23 12:25 AM   Specimen: Vaginal/Rectal; Genital  Result Value Ref Range   Group B strep by PCR PRESUMPTIVE NEGATIVE PRESUMPTIVE NEGATIVE  CBC     Status: Abnormal   Collection Time: 12/15/23 12:30 AM  Result Value Ref Range   WBC 10.7 (H) 4.0 - 10.5 K/uL   RBC 3.65 (L) 3.87 - 5.11 MIL/uL   Hemoglobin 11.5 (L) 12.0 - 15.0 g/dL   HCT 51.8 (L) 84.1 - 66.0 %   MCV 94.0 80.0 - 100.0 fL   MCH 31.5 26.0 - 34.0 pg   MCHC 33.5 30.0 - 36.0 g/dL   RDW 63.0 16.0 - 10.9 %   Platelets 174 150 - 400 K/uL   nRBC 0.0 0.0 - 0.2 %    Assessment / Plan: [redacted]w[redacted]d week IUP IOL for IUGR elevated dopplers GBS presumptive negative Labor: SNM and CNM called to bedside due to fetal tracing and 7-8 minute prolonged deceleration. Terbutaline SQ given for tachysystole and fetal strip, multiple position changes and IV  fluid bolus administered. Fetal tracing improved with interventions and now cat 1 tracing Fetal Wellbeing:  Category 2 with return to cat 1 after interventions above, CNM aware Pain Control:  prn, pt desires unmedicated delivery  Anticipated MOD:  SVD  Herminio Commons, Student-MidWife 12/15/2023 2:59 AM

## 2023-12-15 NOTE — Progress Notes (Signed)
 Labor Progress Note Emelynn Lurlie Wigen is a 23 y.o. G1P0000 at [redacted]w[redacted]d presented for IOL for FGR with elevated dopplers.   S:  Coping well, desires unmedicated, but epidural on request.   O:  BP 117/65 (BP Location: Left Arm)   Pulse 80   Temp 97.6 F (36.4 C) (Axillary)   Resp 16   Ht 5' (1.524 m)   Wt 65.3 kg   LMP 03/22/2023 (Exact Date)   BMI 28.12 kg/m   EFM: baseline 120 bpm/ moderate variability/ 15x15 accels/ none decels  Toco/IUPC: 1-4 SVE: Dilation: 6 Effacement (%): 80 Station: -1 Presentation: Vertex Exam by:: Lamont Snowball, CNM Pitocin: 0 mu/min  A/P: 23 y.o. G1P0000 [redacted]w[redacted]d IOL for FGR with elevated dopplers.   1. Labor: IOL in latent phase s/p FB expulsion, RBA of AROM d/w patient with patient agreeable to and desiring intervention. Moderate clear fluid noted, patient tolerated well. Cervical change from 4/60/-2 to 6/80/-1 2. FWB: Cat 1 3. Pain: Coping well 4. GBS neg   Anticipate NVSB.  Richardson Landry, CNM 10:56 AM

## 2023-12-15 NOTE — Discharge Summary (Signed)
 Postpartum Discharge Summary  Date of Service updated***     Patient Name: Yolanda Dunn DOB: [birth year] MRN: [id number]  Date of admission: 12/15/2023 Delivery date:12/15/2023 Delivering provider: Lamont Snowball A Date of discharge: 12/15/2023  Admitting diagnosis: Encounter for induction of labor [Z34.90] Intrauterine pregnancy: [redacted]w[redacted]d     Secondary diagnosis:  Principal Problem:   Encounter for induction of labor  Additional problems: ***    Discharge diagnosis: Term Pregnancy Delivered                                              Post partum procedures:*** Augmentation: AROM and Cytotec Complications: None  Hospital course: Induction of Labor With Vaginal Delivery   23 y.o. yo G1P0000 at [redacted]w[redacted]d was admitted to the hospital 12/15/2023 for induction of labor.  Indication for induction:  IUGR with elevated dopplers .  Patient had an labor course complicated by precipitous delivery Membrane Rupture Time/Date: 10:45 AM,12/15/2023  Delivery Method:  Operative Delivery:N/A Episiotomy: None Lacerations:  None Details of delivery can be found in separate delivery note.  Patient had a postpartum course complicated by***. Patient is discharged home 12/15/23.  Newborn Data: Birth date:12/15/2023 Birth time:1:14 PM Gender:Female Living status:Living Apgars:9 ,9  Weight:   Magnesium Sulfate received: No BMZ received: No Rhophylac:N/A MMR:N/A T-DaP:Given prenatally Flu: No RSV Vaccine received: No Transfusion:{Transfusion received:30440034}  Immunizations received: Immunization History  Administered Date(s) Administered   Influenza,inj,Quad PF,6+ Mos 08/27/2019   Tdap 10/15/2019, 10/16/2019, 10/09/2023    Physical exam  Vitals:   12/15/23 0249 12/15/23 0522 12/15/23 0723 12/15/23 1252  BP: 129/69 124/65 117/65 124/84  Pulse: 78 63 80 63  Resp:  15 16   Temp:  97.6 F (36.4 C)    TempSrc:  Axillary    Weight:      Height:       General: {Exam;  general:21111117} Lochia: {Desc; appropriate/inappropriate:30686::"appropriate"} Uterine Fundus: {Desc; firm/soft:30687} Incision: {Exam; incision:21111123} DVT Evaluation: {Exam; dvt:2111122} Labs: Lab Results  Component Value Date   WBC 10.7 (H) 12/15/2023   HGB 11.5 (L) 12/15/2023   HCT 34.3 (L) 12/15/2023   MCV 94.0 12/15/2023   PLT 174 12/15/2023      Latest Ref Rng & Units 11/22/2023    2:44 PM  CMP  Glucose 70 - 99 mg/dL 75   BUN 6 - 20 mg/dL 4   Creatinine 4.09 - 8.11 mg/dL 9.14   Sodium 782 - 956 mmol/L 135   Potassium 3.5 - 5.2 mmol/L 4.5   Chloride 96 - 106 mmol/L 103   CO2 20 - 29 mmol/L 19   Calcium 8.7 - 10.2 mg/dL 9.2   Total Protein 6.0 - 8.5 g/dL 6.6   Total Bilirubin 0.0 - 1.2 mg/dL 0.4   Alkaline Phos 44 - 121 IU/L 178   AST 0 - 40 IU/L 17   ALT 0 - 32 IU/L 7    Edinburgh Score:     No data to display         No data recorded  After visit meds:  Allergies as of 12/15/2023   No Known Allergies   Med Rec must be completed prior to using this [facility]***        Discharge home in stable condition Infant Feeding: {Baby feeding:23562} Infant Disposition:{CHL IP OB HOME WITH OZHYQM:57846} Discharge instruction: per After Visit Summary and Postpartum booklet.  Activity: Advance as tolerated. Pelvic rest for 6 weeks.  Diet: {OB ZOXW:96045409} Future Appointments:No future appointments. Follow up Visit:   Please schedule this patient for a In person postpartum visit in 6 weeks with the following provider:  Lamont Snowball, CNM . Additional Postpartum F/U: none   High risk pregnancy complicated by:  IUGR Delivery mode:    Anticipated Birth Control:  Nexplanon   12/15/2023 Richardson Landry, CNM

## 2023-12-15 NOTE — Plan of Care (Signed)
  Problem: Education: Goal: Knowledge of Childbirth will improve Outcome: Completed/Met Goal: Ability to make informed decisions regarding treatment and plan of care will improve Outcome: Completed/Met Goal: Ability to state and carry out methods to decrease the pain will improve Outcome: Completed/Met Goal: Individualized Educational Video(s) Outcome: Completed/Met

## 2023-12-16 LAB — CBC
HCT: 34.3 % — ABNORMAL LOW (ref 36.0–46.0)
Hemoglobin: 11.2 g/dL — ABNORMAL LOW (ref 12.0–15.0)
MCH: 30.8 pg (ref 26.0–34.0)
MCHC: 32.7 g/dL (ref 30.0–36.0)
MCV: 94.2 fL (ref 80.0–100.0)
Platelets: 153 10*3/uL (ref 150–400)
RBC: 3.64 MIL/uL — ABNORMAL LOW (ref 3.87–5.11)
RDW: 13.6 % (ref 11.5–15.5)
WBC: 13.1 10*3/uL — ABNORMAL HIGH (ref 4.0–10.5)
nRBC: 0 % (ref 0.0–0.2)

## 2023-12-16 NOTE — Progress Notes (Addendum)
 POSTPARTUM PROGRESS NOTE  Subjective: Yolanda Dunn is a 23 y.o. G1P1001 s/p NVSB at [redacted]w[redacted]d.  She reports she doing well. No acute events overnight. She denies any problems with ambulating, voiding or po intake. Denies nausea or vomiting. She has  passed flatus. Pain is well controlled.  Lochia is small.  Objective: Blood pressure 131/82, pulse 80, temperature 98.3 F (36.8 C), temperature source Oral, resp. rate 18, height 5' (1.524 m), weight 65.3 kg, last menstrual period 03/22/2023, SpO2 99%, unknown if currently breastfeeding.  Physical Exam:  General: alert, cooperative and no distress Chest: no respiratory distress Abdomen: soft, non-tender  Uterine Fundus: firm, appropriately tender Extremities: No calf swelling or tenderness  no edema  Recent Labs    12/15/23 0030 12/16/23 0318  HGB 11.5* 11.2*  HCT 34.3* 34.3*    Assessment/Plan: Yolanda Dunn is a 23 y.o. G1P1001 s/p NVSB at [redacted]w[redacted]d.  Routine Postpartum Care: Doing well, pain well-controlled.  -- Continue routine care, lactation support  -- Contraception: patient declines Nexplanon in patient, desires at 6 weeks PP  -- Feeding: Breast  Dispo: Plan for discharge 12/17/2023.  Lamont Snowball, MSN, CNM, RNC-OB Certified Nurse Midwife, [facility] 12/16/2023 10:35 AM

## 2023-12-16 NOTE — Lactation Note (Signed)
 This note was copied from a baby's chart. Lactation Consultation Note  Patient Name: Yolanda Dunn ZOXWR'U Date: 12/16/2023 Age:24 hours Reason for consult: Follow-up assessment;Primapara;1st time breastfeeding;Early term 37-38.6wks;Infant weight loss Exclusive pumping only, per mom has been pumping around the baby's feedings times and still not getting any milk.  LC praised mom for her pumping and to continue to be consistent with her pumping. Per mom the  #24 flange has been comfortable.  LC recommended once a day for 60 mins power pumping 15 mins on 10 mins off over 60 mins to enhance let down.  Nurse working with mom on the baby's feedings.  Maternal Data    Feeding Mother's Current Feeding Choice: Breast Milk and Donor Milk   Lactation Tools Discussed/Used Tools: Pump Flange Size: 24 Breast pump type: Double-Electric Breast Pump Pump Education: Milk Storage;Setup, frequency, and cleaning Pumped volume: 0 mL  Interventions Interventions: Breast feeding basics reviewed;Education;DEBP;LC Services brochure;CDC milk storage guidelines;CDC Guidelines for Breast Pump Cleaning  Discharge Pump: Personal;DEBP  Consult Status Consult Status: Follow-up Date: 12/16/23 Follow-up type: In-patient    [name] 12/16/2023, 2:43 PM

## 2023-12-16 NOTE — Lactation Note (Signed)
 This note was copied from a baby's chart. Lactation Consultation Note  Patient Name: Yolanda Dunn Date: 12/16/2023 Age:24 hours Reason for consult: Follow-up assessment;Primapara;1st time breastfeeding;Exclusive pumping and bottle feeding;Early term 37-38.6wks;Infant < 6lbs;Infant weight loss  P1- RN requested for LC to assist with infant's bottle feeding because he still has not been taking appropriate volume of DBM. LC bottle fed infant the DBM with a white Nfant nipple in a side lying position. LC was able to feed infant 20 mL within 25 minutes, but infant still had spillage on the side of his mouth like yesterday. LC believes that infant could benefit from using a slower flow nipple. LC asked MOB how pumping is going. MOB reports that she has been pumping, but only drops are coming out. MOB reports feeling discouraged over her volume, so she is thinking about switching to formula once she discharges. LC reviewed the difference between colostrum vs mature milk. LC also reviewed that she should expect her mature milk anywhere from 2-7 days pp. LC encouraged MOB to give her body more time to adjust. LC encouraged MOB to call for further assistance as needed.  Maternal Data Has patient been taught Hand Expression?: No Does the patient have breastfeeding experience prior to this delivery?: No  Feeding Mother's Current Feeding Choice: Breast Milk and Donor Milk Nipple Type: Nfant Standard Flow (white)  Lactation Tools Discussed/Used Tools: Pump;Flanges Breast pump type: Double-Electric Breast Pump;Manual Pump Education: Setup, frequency, and cleaning;Milk Storage Reason for Pumping: exclusive pumping Pumping frequency: 15-20 min every 3 hrs  Interventions Interventions: Breast feeding basics reviewed;Education;Pace feeding;LC Services brochure  Discharge Discharge Education: Engorgement and breast care;Warning signs for feeding baby Pump: DEBP;Personal  Consult Status Consult  Status: Follow-up Date: 12/17/23 Follow-up type: In-patient    Dema Severin BS, IBCLC 12/16/2023, 7:05 PM
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
## 2023-12-16 NOTE — Progress Notes (Addendum)
 Circumcision Consent  Parent desires circumcision for this female infant.  Circumcision procedure details discussed, risks and benefits of procedure were also discussed.  The benefits include but are not limited to: reduction in the rates of urinary tract infection (UTI), penile cancer, sexually transmitted infections including HIV, penile inflammatory and retractile disorders.  Circumcision also helps obtain better and easier hygiene of the penis.  Risks include but are not limited to: bleeding, infection, injury of glans which may lead to penile deformity or urinary tract issues or Urology intervention, unsatisfactory cosmetic appearance and other potential complications related to the procedure.  It was emphasized that this is an elective procedure.  Fortunato Curling, DO [facility], PGY-1

## 2023-12-17 ENCOUNTER — Other Ambulatory Visit (HOSPITAL_COMMUNITY): Payer: Self-pay

## 2023-12-17 MED ORDER — IBUPROFEN 600 MG PO TABS
600.0000 mg | ORAL_TABLET | Freq: Four times a day (QID) | ORAL | 0 refills | Status: AC
  Filled 2023-12-17: qty 30, 8d supply, fill #0

## 2023-12-17 MED ORDER — ACETAMINOPHEN 325 MG PO TABS
650.0000 mg | ORAL_TABLET | ORAL | 0 refills | Status: AC | PRN
  Filled 2023-12-17: qty 120, 10d supply, fill #0

## 2023-12-17 MED ORDER — SENNOSIDES-DOCUSATE SODIUM 8.6-50 MG PO TABS
2.0000 | ORAL_TABLET | Freq: Two times a day (BID) | ORAL | 0 refills | Status: AC | PRN
  Filled 2023-12-17: qty 30, 8d supply, fill #0

## 2023-12-18 ENCOUNTER — Ambulatory Visit (HOSPITAL_COMMUNITY): Payer: Self-pay

## 2023-12-18 LAB — SURGICAL PATHOLOGY

## 2023-12-18 NOTE — Lactation Note (Signed)
 This note was copied from a baby's chart. Lactation Consultation Note  Patient Name: Yolanda Dunn ZOXWR'U Date: 12/18/2023 Age:24 hours Reason for consult: Follow-up assessment  P1, Praised mother for her efforts with pumping.  She has been pumping 3-4 oz per session and mother states she plans to continue offering breastmilk to her baby once home. Discussed mother possibly taking out piercing jewelry on her nipple to avoid infection.  Reviewed engorgement care and monitoring voids/stools. Baby recently consumed 27 ml with Dr. Theora Gianotti Preemie bottle.  No further questions at this time.   Maternal Data Has patient been taught Hand Expression?: Yes  Feeding Mother's Current Feeding Choice: Breast Milk Nipple Type: Dr. Irving Burton Preemie Type of Nipple: Everted at rest and after stimulation  Comfort (Breast/Nipple): Filling, red/small blisters or bruises, mild/mod discomfort      Lactation Tools Discussed/Used Breast pump type: Double-Electric Breast Pump Pump Education: Setup, frequency, and cleaning;Milk Storage Reason for Pumping: breast filling Pumping frequency: q 3 hours for 15 min Pumped volume: 120 mL  Interventions Interventions: Education;DEBP  Discharge Discharge Education: Engorgement and breast care;Warning signs for feeding baby Pump: Personal;DEBP (Motif)  Consult Status Consult Status: Complete Date: 12/18/23 Follow-up type: In-patient  Hardie Pulley  RN IBCLC 12/18/2023, 10:11 AM

## 2023-12-18 NOTE — Lactation Note (Signed)
 This note was copied from a baby's chart. Lactation Consultation Note  Patient Name: Yolanda Dunn Date: 12/18/2023 Age:25 hours  RN stated mom has mostly been formula feeding d/t baby had low glucoses. Parents has been frustrated w/baby being stuck to check glucose. Asked RN to call for [facility] to speak w/mom. Tech stated parents are sleeping.    Maternal Data    Feeding    LATCH Score                    Lactation Tools Discussed/Used    Interventions    Discharge    Consult Status      Charyl Dancer 12/18/2023, 3:46 AM

## 2023-12-18 NOTE — Lactation Note (Signed)
 This note was copied from a baby's chart. Lactation Consultation Note  Patient Name: Boy Edin Christon NFAOZ'H Date: 12/18/2023 Age:24 hours Reason for consult: Follow-up assessment;Primapara;Early term 37-38.6wks LC was f/u to see how mom was doing. Mom was pumping breast and pumped 4 oz. Discussed milk storage reviewed.  Gave mom storage bottles. Discussed cleaning bottles. Mom was pumping w/colostrum containers still on bottles. LC removed them.  Mom pumping w/nipple piercing's.  Mom isn't putting baby to the breast. Mom is pumping and bottle feeding. Discussed mom pumping and engorgement management.  Maternal Data    Feeding    LATCH Score       Type of Nipple: Everted at rest and after stimulation  Comfort (Breast/Nipple): Filling, red/small blisters or bruises, mild/mod discomfort         Lactation Tools Discussed/Used Breast pump type: Double-Electric Breast Pump Pump Education: Setup, frequency, and cleaning;Milk Storage Reason for Pumping: breast filling Pumping frequency: q 3 hr Pumped volume: 4 mL (3hr)  Interventions Interventions: Expressed milk  Discharge Discharge Education: Engorgement and breast care  Consult Status Consult Status: Follow-up Date: 12/18/23 Follow-up type: In-patient    Charyl Dancer 12/18/2023, 6:56 AM

## 2023-12-26 ENCOUNTER — Telehealth (HOSPITAL_COMMUNITY): Payer: Self-pay | Admitting: *Deleted

## 2023-12-26 NOTE — Telephone Encounter (Signed)
 12/26/2023  Name: Yolanda Dunn MRN: [id number] DOB: 2000-10-21  Reason for Call:  Transition of Care Hospital Discharge Call  Contact Status: Patient Contact Status: Complete  Language assistant needed:          Follow-Up Questions: Do You Have Any Concerns About Your Health As You Heal From Delivery?: No Do You Have Any Concerns About Your Infants Health?: Yes What Concerns Do You Have About Your Baby?: Patient asked, "Is baby acne normal?" RN reassured patient and reviewed warning signs to report to MD. No other questions or concerns voiced at this time.  Edinburgh Postnatal Depression Scale:  In the Past 7 Days: I have been able to laugh and see the funny side of things.: As much as I always could I have looked forward with enjoyment to things.: As much as I ever did I have blamed myself unnecessarily when things went wrong.: No, never I have been anxious or worried for no good reason.: No, not at all I have felt scared or panicky for no good reason.: No, not at all Things have been getting on top of me.: No, I have been coping as well as ever I have been so unhappy that I have had difficulty sleeping.: Not at all I have felt sad or miserable.: No, not at all I have been so unhappy that I have been crying.: No, never The thought of harming myself has occurred to me.: Never Inocente Salles Postnatal Depression Scale Total: 0  PHQ2-9 Depression Scale:     Discharge Follow-up: Edinburgh score requires follow up?: No Patient was advised of the following resources:: Breastfeeding Support Group, Support Group  Post-discharge interventions: Reviewed Newborn Safe Sleep Practices  Signature Deforest Hoyles, RN, 12/26/23, [phone]

## 2024-01-05 ENCOUNTER — Encounter: Payer: Self-pay | Admitting: Certified Nurse Midwife

## 2024-01-15 ENCOUNTER — Ambulatory Visit: Admitting: Certified Nurse Midwife

## 2024-01-15 NOTE — Progress Notes (Signed)
 Patient did not come to appointment. Please reschedule.   Raford Bunk, MSN, CNM, RNC-OB Certified Nurse Midwife, [facility] 01/15/2024 12:16 PM

## 2024-01-23 NOTE — Progress Notes (Deleted)
    Post Partum Visit Note  Yolanda Dunn is a 23 y.o. G62P1001 female who presents for a postpartum visit. She is 5 weeks postpartum following a normal spontaneous vaginal delivery.  I have fully reviewed the prenatal and intrapartum course. The delivery was at *** gestational weeks.  Anesthesia: {anesthesia types:812}. Postpartum course has been ***. Baby is doing well***. Baby is feeding by {breastmilk/bottle:69}. Bleeding {vag bleed:12292}. Bowel function is {normal:32111}. Bladder function is {normal:32111}. Patient {is/is not:9024} sexually active. Contraception method is {contraceptive method:5051}. Postpartum depression screening: {gen negative/positive:315881}.   The pregnancy intention screening data noted above was reviewed. Potential methods of contraception were discussed. The patient elected to proceed with No data recorded.    Health Maintenance Due  Topic Date Due   HPV VACCINES (1 - 3-dose series) Never done   Meningococcal B Vaccine (1 of 2 - Standard) Never done   COVID-19 Vaccine (1 - 2024-25 season) Never done    {Common ambulatory SmartLinks:19316}  Review of Systems {ros; complete:30496}  Objective:  LMP 03/22/2023 (Exact Date)    General:  {gen appearance:16600}   Breasts:  {desc; normal/abnormal/not indicated:14647}  Lungs: {lung exam:16931}  Heart:  {heart exam:5510}  Abdomen: {abdomen exam:16834}   Wound {Wound assessment:11097}  GU exam:  {desc; normal/abnormal/not indicated:14647}       Assessment:    There are no diagnoses linked to this encounter.  *** postpartum exam.   Plan:   Essential components of care per ACOG recommendations:  1.  Mood and well being: Patient with {gen negative/positive:315881} depression screening today. Reviewed local resources for support.  - Patient tobacco use? {tobacco use:25506}  - hx of drug use? {yes/no:25505}    2. Infant care and feeding:  -Patient currently breastmilk feeding? {yes/no:25502}   -Social determinants of health (SDOH) reviewed in EPIC. No concerns***The following needs were identified***  3. Sexuality, contraception and birth spacing - Patient {DOES_DOES NGE:95284} want a pregnancy in the next year.  Desired family size is {NUMBER 1-10:22536} children.  - Reviewed reproductive life planning. Reviewed contraceptive methods based on pt preferences and effectiveness.  Patient desired {Upstream End Methods:24109} today.   - Discussed birth spacing of 18 months  4. Sleep and fatigue -Encouraged family/partner/community support of 4 hrs of uninterrupted sleep to help with mood and fatigue  5. Physical Recovery  - Discussed patients delivery and complications. She describes her labor as {description:25511} - Patient had a {CHL AMB [phone]}. Patient had a {laceration:25518} laceration. Perineal healing reviewed. Patient expressed understanding - Patient has urinary incontinence? {yes/no:25515} - Patient {ACTION; IS/IS XLK:44010272} safe to resume physical and sexual activity  6.  Health Maintenance - HM due items addressed {Yes or If no, why not?:20788} - Last pap smear  Diagnosis  Date Value Ref Range Status  06/13/2023   Final   - Negative for intraepithelial lesion or malignancy (NILM)   Pap smear {done:10129} at today's visit.  -Breast Cancer screening indicated? {indicated:25516}  7. Chronic Disease/Pregnancy Condition follow up: {Follow up:25499}  - PCP follow up  Alesia Husky, RMA [facility], [facility]

## 2024-01-24 ENCOUNTER — Ambulatory Visit: Admitting: Family Medicine

## 2024-03-12 ENCOUNTER — Ambulatory Visit: Admitting: Obstetrics and Gynecology

## 2024-04-09 ENCOUNTER — Ambulatory Visit

## 2024-05-05 ENCOUNTER — Encounter: Payer: Self-pay | Admitting: Certified Nurse Midwife

## 2024-05-30 ENCOUNTER — Ambulatory Visit: Admitting: Family Medicine

## 2024-06-16 ENCOUNTER — Encounter: Payer: Self-pay | Admitting: Obstetrics & Gynecology

## 2024-07-24 ENCOUNTER — Ambulatory Visit: Admitting: Obstetrics and Gynecology
